# Patient Record
Sex: Female | Born: 1935 | Race: White | Hispanic: No | Smoking: Never smoker
Health system: Southern US, Community
[De-identification: ages and names within clinical notes are randomized; demographics above are authoritative.]

## PROBLEM LIST (undated history)

## (undated) DIAGNOSIS — K8020 Calculus of gallbladder without cholecystitis without obstruction: Secondary | ICD-10-CM

## (undated) DIAGNOSIS — I639 Cerebral infarction, unspecified: Secondary | ICD-10-CM

## (undated) DIAGNOSIS — K219 Gastro-esophageal reflux disease without esophagitis: Secondary | ICD-10-CM

## (undated) DIAGNOSIS — I10 Essential (primary) hypertension: Secondary | ICD-10-CM

## (undated) DIAGNOSIS — F319 Bipolar disorder, unspecified: Secondary | ICD-10-CM

## (undated) DIAGNOSIS — M1990 Unspecified osteoarthritis, unspecified site: Secondary | ICD-10-CM

## (undated) DIAGNOSIS — G629 Polyneuropathy, unspecified: Secondary | ICD-10-CM

## (undated) DIAGNOSIS — C50919 Malignant neoplasm of unspecified site of unspecified female breast: Secondary | ICD-10-CM

## (undated) DIAGNOSIS — I700 Atherosclerosis of aorta: Secondary | ICD-10-CM

## (undated) DIAGNOSIS — I4891 Unspecified atrial fibrillation: Secondary | ICD-10-CM

## (undated) DIAGNOSIS — C801 Malignant (primary) neoplasm, unspecified: Secondary | ICD-10-CM

## (undated) DIAGNOSIS — D3500 Benign neoplasm of unspecified adrenal gland: Secondary | ICD-10-CM

## (undated) HISTORY — DX: Polyneuropathy, unspecified: G62.9

## (undated) HISTORY — PX: APPENDECTOMY: SHX54

## (undated) HISTORY — DX: Gastro-esophageal reflux disease without esophagitis: K21.9

## (undated) HISTORY — DX: Cerebral infarction, unspecified: I63.9

---
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
## 1994-06-29 HISTORY — PX: PARATHYROIDECTOMY: SHX19

## 2008-06-29 HISTORY — PX: HERNIA REPAIR: SHX51

## 2010-06-29 HISTORY — PX: BREAST LUMPECTOMY: SHX2

## 2011-06-30 DIAGNOSIS — Z853 Personal history of malignant neoplasm of breast: Secondary | ICD-10-CM

## 2011-06-30 HISTORY — DX: Personal history of malignant neoplasm of breast: Z85.3

## 2014-06-29 DIAGNOSIS — D3500 Benign neoplasm of unspecified adrenal gland: Secondary | ICD-10-CM

## 2014-06-29 DIAGNOSIS — I700 Atherosclerosis of aorta: Secondary | ICD-10-CM

## 2014-06-29 DIAGNOSIS — K8020 Calculus of gallbladder without cholecystitis without obstruction: Secondary | ICD-10-CM

## 2014-06-29 DIAGNOSIS — I480 Paroxysmal atrial fibrillation: Secondary | ICD-10-CM

## 2014-06-29 HISTORY — DX: Calculus of gallbladder without cholecystitis without obstruction: K80.20

## 2014-06-29 HISTORY — DX: Benign neoplasm of unspecified adrenal gland: D35.00

## 2014-06-29 HISTORY — DX: Paroxysmal atrial fibrillation: I48.0

## 2014-06-29 HISTORY — DX: Atherosclerosis of aorta: I70.0

## 2016-04-02 DIAGNOSIS — I10 Essential (primary) hypertension: Secondary | ICD-10-CM | POA: Insufficient documentation

## 2016-04-02 DIAGNOSIS — E039 Hypothyroidism, unspecified: Secondary | ICD-10-CM | POA: Insufficient documentation

## 2016-04-02 DIAGNOSIS — G609 Hereditary and idiopathic neuropathy, unspecified: Secondary | ICD-10-CM | POA: Insufficient documentation

## 2016-04-02 DIAGNOSIS — I700 Atherosclerosis of aorta: Secondary | ICD-10-CM | POA: Insufficient documentation

## 2016-04-02 DIAGNOSIS — I480 Paroxysmal atrial fibrillation: Secondary | ICD-10-CM | POA: Insufficient documentation

## 2016-04-02 DIAGNOSIS — R29898 Other symptoms and signs involving the musculoskeletal system: Secondary | ICD-10-CM | POA: Insufficient documentation

## 2016-04-02 DIAGNOSIS — D3500 Benign neoplasm of unspecified adrenal gland: Secondary | ICD-10-CM | POA: Insufficient documentation

## 2016-04-02 DIAGNOSIS — M159 Polyosteoarthritis, unspecified: Secondary | ICD-10-CM | POA: Insufficient documentation

## 2016-04-02 DIAGNOSIS — N3941 Urge incontinence: Secondary | ICD-10-CM | POA: Insufficient documentation

## 2016-06-29 DIAGNOSIS — K7460 Unspecified cirrhosis of liver: Secondary | ICD-10-CM

## 2016-06-29 DIAGNOSIS — T50905A Adverse effect of unspecified drugs, medicaments and biological substances, initial encounter: Secondary | ICD-10-CM

## 2016-06-29 HISTORY — DX: Unspecified cirrhosis of liver: K74.60

## 2016-06-29 HISTORY — DX: Unspecified cirrhosis of liver: T50.905A

## 2016-10-26 ENCOUNTER — Encounter: Payer: Self-pay | Admitting: Emergency Medicine

## 2016-10-26 ENCOUNTER — Emergency Department: Payer: Medicare Other

## 2016-10-26 ENCOUNTER — Inpatient Hospital Stay
Admission: EM | Admit: 2016-10-26 | Discharge: 2016-10-29 | DRG: 947 | Disposition: A | Discharge status: Home Health | Payer: Medicare Other | Attending: Internal Medicine | Admitting: Internal Medicine

## 2016-10-26 DIAGNOSIS — Z8659 Personal history of other mental and behavioral disorders: Secondary | ICD-10-CM

## 2016-10-26 DIAGNOSIS — K7460 Unspecified cirrhosis of liver: Secondary | ICD-10-CM | POA: Diagnosis present

## 2016-10-26 DIAGNOSIS — G9341 Metabolic encephalopathy: Secondary | ICD-10-CM | POA: Diagnosis present

## 2016-10-26 DIAGNOSIS — K8020 Calculus of gallbladder without cholecystitis without obstruction: Secondary | ICD-10-CM

## 2016-10-26 DIAGNOSIS — F319 Bipolar disorder, unspecified: Secondary | ICD-10-CM | POA: Diagnosis present

## 2016-10-26 DIAGNOSIS — B179 Acute viral hepatitis, unspecified: Secondary | ICD-10-CM

## 2016-10-26 DIAGNOSIS — R5381 Other malaise: Secondary | ICD-10-CM | POA: Diagnosis present

## 2016-10-26 DIAGNOSIS — E039 Hypothyroidism, unspecified: Secondary | ICD-10-CM | POA: Diagnosis present

## 2016-10-26 DIAGNOSIS — I10 Essential (primary) hypertension: Secondary | ICD-10-CM | POA: Diagnosis present

## 2016-10-26 DIAGNOSIS — T426X5A Adverse effect of other antiepileptic and sedative-hypnotic drugs, initial encounter: Secondary | ICD-10-CM | POA: Diagnosis present

## 2016-10-26 DIAGNOSIS — Z7982 Long term (current) use of aspirin: Secondary | ICD-10-CM | POA: Diagnosis not present

## 2016-10-26 DIAGNOSIS — R7989 Other specified abnormal findings of blood chemistry: Secondary | ICD-10-CM | POA: Diagnosis present

## 2016-10-26 DIAGNOSIS — R531 Weakness: Secondary | ICD-10-CM | POA: Diagnosis present

## 2016-10-26 DIAGNOSIS — Z853 Personal history of malignant neoplasm of breast: Secondary | ICD-10-CM

## 2016-10-26 DIAGNOSIS — R945 Abnormal results of liver function studies: Secondary | ICD-10-CM

## 2016-10-26 DIAGNOSIS — Z79899 Other long term (current) drug therapy: Secondary | ICD-10-CM | POA: Diagnosis not present

## 2016-10-26 DIAGNOSIS — G4700 Insomnia, unspecified: Secondary | ICD-10-CM

## 2016-10-26 HISTORY — DX: Malignant (primary) neoplasm, unspecified: C80.1

## 2016-10-26 HISTORY — DX: Bipolar disorder, unspecified: F31.9

## 2016-10-26 HISTORY — DX: Calculus of gallbladder without cholecystitis without obstruction: K80.20

## 2016-10-26 HISTORY — DX: Benign neoplasm of unspecified adrenal gland: D35.00

## 2016-10-26 HISTORY — DX: Unspecified atrial fibrillation: I48.91

## 2016-10-26 HISTORY — DX: Atherosclerosis of aorta: I70.0

## 2016-10-26 HISTORY — DX: Unspecified osteoarthritis, unspecified site: M19.90

## 2016-10-26 HISTORY — DX: Essential (primary) hypertension: I10

## 2016-10-26 LAB — COMPREHENSIVE METABOLIC PANEL
ALK PHOS: 127 U/L — AB (ref 38–126)
ALT: 752 U/L — AB (ref 14–54)
ANION GAP: 9 (ref 5–15)
AST: 1129 U/L — ABNORMAL HIGH (ref 15–41)
Albumin: 3.6 g/dL (ref 3.5–5.0)
BUN: 22 mg/dL — ABNORMAL HIGH (ref 6–20)
CALCIUM: 9.5 mg/dL (ref 8.9–10.3)
CHLORIDE: 109 mmol/L (ref 101–111)
CO2: 23 mmol/L (ref 22–32)
Creatinine, Ser: 0.91 mg/dL (ref 0.44–1.00)
GFR, EST NON AFRICAN AMERICAN: 58 mL/min — AB (ref >60)
Glucose, Bld: 178 mg/dL — ABNORMAL HIGH (ref 65–99)
Potassium: 3.5 mmol/L (ref 3.5–5.1)
Sodium: 141 mmol/L (ref 135–145)
Total Bilirubin: 3.5 mg/dL — ABNORMAL HIGH (ref 0.3–1.2)
Total Protein: 6.9 g/dL (ref 6.5–8.1)

## 2016-10-26 LAB — ACETAMINOPHEN LEVEL: Acetaminophen (Tylenol), Serum: <10 ug/mL — ABNORMAL LOW (ref 10–30)

## 2016-10-26 LAB — BILIRUBIN, FRACTIONATED(TOT/DIR/INDIR)
BILIRUBIN INDIRECT: 1.5 mg/dL — AB (ref 0.3–0.9)
Bilirubin, Direct: 2.5 mg/dL — ABNORMAL HIGH (ref 0.1–0.5)
Total Bilirubin: 4 mg/dL — ABNORMAL HIGH (ref 0.3–1.2)

## 2016-10-26 LAB — TSH: TSH: 0.812 u[IU]/mL (ref 0.350–4.500)

## 2016-10-26 LAB — CBC
HCT: 43.7 % (ref 35.0–47.0)
Hemoglobin: 14.6 g/dL (ref 12.0–16.0)
MCH: 31.5 pg (ref 26.0–34.0)
MCHC: 33.4 g/dL (ref 32.0–36.0)
MCV: 94.3 fL (ref 80.0–100.0)
PLATELETS: 144 10*3/uL — AB (ref 150–440)
RBC: 4.63 MIL/uL (ref 3.80–5.20)
RDW: 14 % (ref 11.5–14.5)
WBC: 14.5 10*3/uL — ABNORMAL HIGH (ref 3.6–11.0)

## 2016-10-26 LAB — AMMONIA: Ammonia: 21 umol/L (ref 9–35)

## 2016-10-26 LAB — VALPROIC ACID LEVEL: Valproic Acid Lvl: 54 ug/mL (ref 50.0–100.0)

## 2016-10-26 LAB — PROTIME-INR
INR: 1.06
PROTHROMBIN TIME: 13.8 s (ref 11.4–15.2)

## 2016-10-26 LAB — CK: Total CK: 28 U/L — ABNORMAL LOW (ref 38–234)

## 2016-10-26 LAB — GAMMA GT: GGT: 387 U/L — AB (ref 7–50)

## 2016-10-26 LAB — TROPONIN I

## 2016-10-26 MED ORDER — ONDANSETRON HCL 4 MG/2ML IJ SOLN: 4.0000 mg | Freq: Four times a day (QID) | INTRAMUSCULAR | Status: DC | PRN

## 2016-10-26 MED ORDER — ONDANSETRON HCL 4 MG PO TABS: 4.0000 mg | ORAL_TABLET | Freq: Four times a day (QID) | ORAL | Status: DC | PRN

## 2016-10-26 MED ORDER — SODIUM CHLORIDE 0.9 % IV SOLN
INTRAVENOUS | Status: DC
Start: 2016-10-26 — End: 2016-10-28
  Administered 2016-10-26 – 2016-10-28 (×4): via INTRAVENOUS

## 2016-10-26 MED ORDER — SENNOSIDES-DOCUSATE SODIUM 8.6-50 MG PO TABS: 1.0000 | ORAL_TABLET | Freq: Every evening | ORAL | Status: DC | PRN

## 2016-10-26 MED ORDER — SODIUM CHLORIDE 0.9 % IV SOLN
1000.0000 mL | Freq: Once | INTRAVENOUS | Status: AC
  Administered 2016-10-26: 1000 mL via INTRAVENOUS

## 2016-10-26 MED ORDER — ENOXAPARIN SODIUM 40 MG/0.4ML ~~LOC~~ SOLN
40.0000 mg | SUBCUTANEOUS | Status: DC
Start: 2016-10-26 — End: 2016-10-29
  Administered 2016-10-26 – 2016-10-28 (×3): 40 mg via SUBCUTANEOUS
  Filled 2016-10-26 (×3): qty 0.4

## 2016-10-26 MED ORDER — LEVOTHYROXINE SODIUM 88 MCG PO TABS
88.0000 ug | ORAL_TABLET | Freq: Every day | ORAL | Status: DC
  Administered 2016-10-27 – 2016-10-29 (×3): 88 ug via ORAL
  Filled 2016-10-26 (×4): qty 1

## 2016-10-26 MED ORDER — ALBUTEROL SULFATE (2.5 MG/3ML) 0.083% IN NEBU: 2.5000 mg | INHALATION_SOLUTION | RESPIRATORY_TRACT | Status: DC | PRN

## 2016-10-26 MED ORDER — METOPROLOL TARTRATE 25 MG PO TABS
12.5000 mg | ORAL_TABLET | Freq: Two times a day (BID) | ORAL | Status: DC
  Administered 2016-10-26 – 2016-10-29 (×7): 12.5 mg via ORAL
  Filled 2016-10-26 (×7): qty 1

## 2016-10-26 MED ORDER — LOSARTAN POTASSIUM 50 MG PO TABS
25.0000 mg | ORAL_TABLET | Freq: Every day | ORAL | Status: DC
  Administered 2016-10-26 – 2016-10-29 (×4): 25 mg via ORAL
  Filled 2016-10-26 (×4): qty 1

## 2016-10-26 NOTE — H&P (Signed)
[facility] [place] NAME: Christy Mendoza    MR#:  [id number]  DATE OF BIRTH:  Jun 27, 1936  DATE OF ADMISSION:  10/26/2016  PRIMARY CARE PHYSICIAN: Tracie Harrier, MD   REQUESTING/REFERRING PHYSICIAN: Lavonia Drafts, MD  CHIEF COMPLAINT:   Chief Complaint  Patient presents with  . Weakness   Weakness and confusion today. HISTORY OF PRESENT ILLNESS:  Christy Mendoza  is a 81 y.o. female with a known history of bipolar disorder, breast cancer status post the surgery and hypertension.the patient was sent to ED due to falling on the table, unable to get up. She was found weak, onfused and drowsy.she denies any syncope, seizure, loss of consciousness or incontinence.she was found elevated AST 1129, ALT 752. DR. Vicente Males suggest that admitted patient for further workup. PAST MEDICAL HISTORY:   Past Medical History:  Diagnosis Date  . Bipolar 1 disorder ([place])   . Cancer ([place])    left breast  . Hypertension     PAST SURGICAL HISTORY:  History reviewed. No pertinent surgical history.  SOCIAL HISTORY:   Social History  Substance Use Topics  . Smoking status: Never Smoker  . Smokeless tobacco: Never Used  . Alcohol use No    FAMILY HISTORY:  History reviewed. No pertinent family history.  DRUG ALLERGIES:   Allergies  Allergen Reactions  . Hydrochlorothiazide Shortness Of Breath  . Lisinopril Shortness Of Breath  . Cephalexin Other (See Comments)    REVIEW OF SYSTEMS:   Review of Systems  Constitutional: Positive for malaise/fatigue. Negative for chills and fever.  HENT: Negative for sore throat.   Eyes: Negative for blurred vision and double vision.  Respiratory: Negative for cough, shortness of breath, wheezing and stridor.   Cardiovascular: Negative for chest pain, palpitations and leg swelling.  Gastrointestinal: Positive for nausea. Negative for abdominal pain, blood in stool, constipation, diarrhea, melena and vomiting.    Genitourinary: Negative for dysuria and hematuria.  Musculoskeletal: Negative for back pain.  Skin: Negative for itching and rash.  Neurological: Positive for dizziness and weakness. Negative for focal weakness, loss of consciousness and headaches.    MEDICATIONS AT HOME:   Prior to Admission medications   Medication Sig Start Date End Date Taking? Authorizing Provider  aspirin 81 MG chewable tablet Chew 81 mg by mouth daily.   Yes Historical Provider, MD  folic acid (FOLVITE) 1 MG tablet Take 1 mg by mouth 2 (two) times daily.   Yes Historical Provider, MD  gabapentin (NEURONTIN) 600 MG tablet Take 1,200 mg by mouth at bedtime. 10/11/16  Yes Historical Provider, MD  levothyroxine (SYNTHROID, LEVOTHROID) 88 MCG tablet Take 88 mcg by mouth daily. 09/30/16  Yes Historical Provider, MD  losartan (COZAAR) 25 MG tablet Take 25 mg by mouth daily. 10/11/16  Yes Historical Provider, MD  metoprolol tartrate (LOPRESSOR) 25 MG tablet Take 12.5 mg by mouth 2 (two) times daily. 10/12/16  Yes Historical Provider, MD  valproic acid (DEPAKENE) 250 MG capsule Take 500 mg by mouth 2 (two) times daily. 10/14/16  Yes Historical Provider, MD  vitamin B-12 (CYANOCOBALAMIN) 500 MCG tablet Take 500 mcg by mouth 2 (two) times daily.   Yes Historical Provider, MD      VITAL SIGNS:  Blood pressure (!) 156/76, pulse 85, temperature 98.6 F (37 C), temperature source Oral, resp. rate 18, height 5\' 5"  (1.651 m), weight 189 lb 2 oz (85.8 kg), SpO2 95 %.  PHYSICAL EXAMINATION:  Physical Exam  GENERAL:  81 y.o.-year-old patient lying in the bed with no acute distress.  EYES: Pupils equal, round, reactive to light and accommodation. No scleral icterus. Extraocular muscles intact.  HEENT: Head atraumatic, normocephalic. Oropharynx and nasopharynx clear.  NECK:  Supple, no jugular venous distention. No thyroid enlargement, no tenderness.  LUNGS: Normal breath sounds bilaterally, no wheezing, rales,rhonchi or crepitation. No  use of accessory muscles of respiration.  CARDIOVASCULAR: S1, S2 normal. No murmurs, rubs, or gallops.  ABDOMEN: Soft, nontender, nondistended. Bowel sounds present. No organomegaly or mass.  EXTREMITIES: No pedal edema, cyanosis, or clubbing.  NEUROLOGIC: Cranial nerves II through XII are intact. Muscle strength 5/5 in all extremities. Sensation intact. Gait not checked.  PSYCHIATRIC: The patient is alert and oriented x 3.  SKIN: No obvious rash, lesion, or ulcer.   LABORATORY PANEL:   CBC  Recent Labs Lab 10/26/16 0817  WBC 14.5*  HGB 14.6  HCT 43.7  PLT 144*   ------------------------------------------------------------------------------------------------------------------  Chemistries   Recent Labs Lab 10/26/16 0817 10/26/16 1225  NA 141  --   K 3.5  --   CL 109  --   CO2 23  --   GLUCOSE 178*  --   BUN 22*  --   CREATININE 0.91  --   CALCIUM 9.5  --   AST 1,129*  --   ALT 752*  --   ALKPHOS 127*  --   BILITOT 3.5* 4.0*   ------------------------------------------------------------------------------------------------------------------  Cardiac Enzymes  Recent Labs Lab 10/26/16 0817  TROPONINI <0.03   ------------------------------------------------------------------------------------------------------------------  RADIOLOGY:  Dg Chest 1 View  Result Date: 10/26/2016 CLINICAL DATA:  Syncope.  Patient status post fall.  Weakness. EXAM: CHEST 1 VIEW COMPARISON:  None. FINDINGS: Monitoring leads overlie the patient. Normal cardiac and mediastinal contours. Low lung volumes. No consolidative pulmonary opacities. No pleural effusion or pneumothorax. Surgical clips left chest wall. IMPRESSION: No acute cardiopulmonary process. Electronically Signed   By: [name] M.D.   On: 10/26/2016 08:01   Ct Head Wo Contrast  Result Date: 10/26/2016 CLINICAL DATA:  Fall, weakness, nausea/ vomiting EXAM: CT HEAD WITHOUT CONTRAST TECHNIQUE: Contiguous axial images were  obtained from the base of the skull through the vertex without intravenous contrast. COMPARISON:  None. FINDINGS: Brain: No evidence of acute infarction, hemorrhage, hydrocephalus, extra-axial collection or mass lesion/mass effect. Subcortical white matter and periventricular small vessel ischemic changes. 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 Global cortical atrophy. Vascular: Intracranial atherosclerosis. Skull: Normal. Negative for fracture or focal lesion. Sinuses/Orbits: The visualized paranasal sinuses are essentially clear. The mastoid air cells are unopacified. Other: None. IMPRESSION: No evidence of acute intracranial abnormality. Atrophy with small vessel ischemic changes. 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 Electronically Signed   By: [name] M.D.   On: 10/26/2016 08:05   US Abdomen Limited Ruq  Result Date: 10/26/2016 CLINICAL DATA:  Elevated liver function studies EXAM: US ABDOMEN LIMITED - RIGHT UPPER QUADRANT COMPARISON:  None in PACs FINDINGS: Gallbladder: The gallbladder is adequately distended. There are echogenic mobile partially shadowing structures within the gallbladder compatible with stones with the largest measuring 1.3 cm. There is mild gallbladder wall thickening to have 3.8 mm. There is no positive sonographic Murphy's sign nor is there pericholecystic fluid. Common bile duct: Diameter: 6 mm.  No intraluminal stones are observed. Liver: The hepatic echotexture is mildly increased. It was difficult to penetrate the organ with the ultrasound beam. No discrete mass or ductal dilation is observed. The limited  visualization of the right kidney reveals mildly increased cortical echotexture. IMPRESSION: Gallstones with mild gallbladder wall thickening but no positive sonographic Murphy's sign to suggest acute cholecystitis. Normal appearing common bile duct. Increased hepatic echotexture consistent with known cirrhosis. Mildly increased  cortical echotexture of the partially imaged right kidney may reflect medical renal disease. Electronically Signed   By: [name] M.D.   On: 10/26/2016 10:11      IMPRESSION AND PLAN:   Abnormal liver function tests., Possible due to Depakote The patient will be admitted to medical floor. Hold Depakote, follow-up valproic acid level and ammonia level. Nothing by mouth except medication, IV fluid support. Check hepatitis panel. Follow-up liver function test and a GI consult.  Altered mental status, acute Encephalopathy. Possible due to medication Depakote. Follow-up ammonia level.  Liver cirrhosis. Follow-up GI consult.  All the records are reviewed and case discussed with ED provider. Management plans discussed with the patient, her daughterand they are in agreement.  CODE STATUS: Full code  TOTAL TIME TAKING CARE OF THIS PATIENT: 60 minutes.    Demetrios Loll M.D on 10/26/2016 at 2:18 PM  Between 7am to 6pm - Pager - [phone]  After 6pm go to [email or web address] - Proofreader  [facility]  Office  [phone]  CC: Primary care physician; Tracie Harrier, MD   Note: This dictation was prepared with Dragon dictation along with smaller phrase technology. Any transcriptional errors that result from this process are unintentional.

## 2016-10-26 NOTE — Consult Note (Signed)
Jonathon Bellows MD  [address]. [place], [place] [address] Phone: [phone] Fax : [phone]  Consultation  Referring Provider:    Dr Corky Downs  Primary Care Physician:  Tracie Harrier, MD Primary Gastroenterologist:  None          Reason for Consultation:     Abnormal LFT  Date of Admission:  10/26/2016 Date of Consultation:  10/26/2016         HPI:   Christy Mendoza is a 81 y.o. female presented to the ER a few hours back after a fall , no injury , she had vomited earlier in the day which was non bloody. I was consulted as it was incidentally noted that she had elevated transaminases.   She denies any recent diziness, did not loose consciousness during the fall, does not feel dizzy when she stands up , no recent excess tylenol use, no consumption of mushrooms or over the counter herbal supplements. No tatoos, military service, blood transfusions or  Alcohol consumption./ She is not aware of having cirrhosis of the liver.      Past Medical History:  Diagnosis Date  . Bipolar 1 disorder ([place])   . Cancer [facility])    left breast  . Hypertension     History reviewed. No pertinent surgical history.  Prior to Admission medications   Medication Sig Start Date End Date Taking? Authorizing Provider  gabapentin (NEURONTIN) 600 MG tablet Take 1,200 mg by mouth at bedtime. 10/11/16  Yes Historical Provider, MD  levothyroxine (SYNTHROID, LEVOTHROID) 88 MCG tablet Take 88 mcg by mouth daily. 09/30/16  Yes Historical Provider, MD  losartan (COZAAR) 25 MG tablet Take 25 mg by mouth daily. 10/11/16  Yes Historical Provider, MD  metoprolol tartrate (LOPRESSOR) 25 MG tablet Take 12.5 mg by mouth 2 (two) times daily. 10/12/16  Yes Historical Provider, MD  valproic acid (DEPAKENE) 250 MG capsule Take 500 mg by mouth 2 (two) times daily. 10/14/16  Yes Historical Provider, MD    History reviewed. No pertinent family history.   Social History  Substance Use Topics  . Smoking status: Never Smoker  .  Smokeless tobacco: Never Used  . Alcohol use No    Allergies as of 10/26/2016  . (No Known Allergies)    Review of Systems:    All systems reviewed and negative except where noted in HPI.   Physical Exam:  Vital signs in last 24 hours: Temp:  [98.6 F (37 C)] 98.6 F (37 C) (04/30 0736) Pulse Rate:  [80-89] 85 (04/30 1017) Resp:  [15-19] 19 (04/30 1017) BP: (126-140)/(71-89) 136/71 (04/30 1017) SpO2:  [93 %-96 %] 93 % (04/30 1017) Weight:  [200 lb (90.7 kg)] 200 lb (90.7 kg) (04/30 0738)   General:   Pleasant, cooperative in NAD Head:  Normocephalic and atraumatic. Eyes:   No icterus.   Conjunctiva pink. PERRLA. Ears:  Normal auditory acuity. Neck:  Supple; no masses or thyroidomegaly Lungs: Respirations even and unlabored. Lungs clear to auscultation bilaterally.   No wheezes, crackles, or rhonchi.  Heart:  Regular rate and rhythm;  Without murmur, clicks, rubs or gallops Abdomen:  Soft, nondistended, nontender. Normal bowel sounds. No appreciable masses or hepatomegaly.  No rebound or guarding.  Rectal:  Not performed. Msk:  Symmetrical without gross deformities.   Extremities:  Without edema, cyanosis or clubbing. Neurologic:  Alert and oriented x3;  grossly normal neurologically. Psych:  Alert and cooperative. Normal affect.  LAB RESULTS:  Recent Labs  10/26/16 0817  WBC 14.5*  HGB  14.6  HCT 43.7  PLT 144*   BMET  Recent Labs  10/26/16 0817  NA 141  K 3.5  CL 109  CO2 23  GLUCOSE 178*  BUN 22*  CREATININE 0.91  CALCIUM 9.5   LFT  Recent Labs  10/26/16 0817  PROT 6.9  ALBUMIN 3.6  AST 1,129*  ALT 752*  ALKPHOS 127*  BILITOT 3.5*   PT/INR No results for input(s): LABPROT, INR in the last 72 hours.  STUDIES: Dg Chest 1 View  Result Date: 10/26/2016 CLINICAL DATA:  Syncope.  Patient status post fall.  Weakness. EXAM: CHEST 1 VIEW COMPARISON:  None. FINDINGS: Monitoring leads overlie the patient. Normal cardiac and mediastinal contours. Low  lung volumes. No consolidative pulmonary opacities. No pleural effusion or pneumothorax. Surgical clips left chest wall. IMPRESSION: No acute cardiopulmonary process. Electronically Signed   By: [name] M.D.   On: 10/26/2016 08:01   Ct Head Wo Contrast  Result Date: 10/26/2016 CLINICAL DATA:  Fall, weakness, nausea/ vomiting EXAM: CT HEAD WITHOUT CONTRAST TECHNIQUE: Contiguous axial images were obtained from the base of the skull through the vertex without intravenous contrast. COMPARISON:  None. FINDINGS: Brain: No evidence of acute infarction, hemorrhage, hydrocephalus, extra-axial collection or mass lesion/mass effect. Subcortical white matter and periventricular small vessel ischemic changes. 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 Global cortical atrophy. Vascular: Intracranial atherosclerosis. Skull: Normal. Negative for fracture or focal lesion. Sinuses/Orbits: The visualized paranasal sinuses are essentially clear. The mastoid air cells are unopacified. Other: None. IMPRESSION: No evidence of acute intracranial abnormality. Atrophy with small vessel ischemic changes. 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 Electronically Signed   By: [name] M.D.   On: 10/26/2016 08:05   US Abdomen Limited Ruq  Result Date: 10/26/2016 CLINICAL DATA:  Elevated liver function studies EXAM: US ABDOMEN LIMITED - RIGHT UPPER QUADRANT COMPARISON:  None in PACs FINDINGS: Gallbladder: The gallbladder is adequately distended. There are echogenic mobile partially shadowing structures within the gallbladder compatible with stones with the largest measuring 1.3 cm. There is mild gallbladder wall thickening to have 3.8 mm. There is no positive sonographic Murphy's sign nor is there pericholecystic fluid. Common bile duct: Diameter: 6 mm.  No intraluminal stones are observed. Liver: The hepatic echotexture is mildly increased. It was difficult to penetrate the organ with the  ultrasound beam. No discrete mass or ductal dilation is observed. The limited visualization of the right kidney reveals mildly increased cortical echotexture. IMPRESSION: Gallstones with mild gallbladder wall thickening but no positive sonographic Murphy's sign to suggest acute cholecystitis. Normal appearing common bile duct. Increased hepatic echotexture consistent with known cirrhosis. Mildly increased cortical echotexture of the partially imaged right kidney may reflect medical renal disease. Electronically Signed   By: [name] M.D.   On: 10/26/2016 10:11      Impression / Plan:   Christy Mendoza is a 81 y.o. y/o female presented to the ER after a fall and was noted to have elevated transaminases. The pattern of elevattion can be seen in muscle injury from a fall or from hepatitis from ischemia, viral or toxins.   It is extremely rare for Gabapentin to cause this degree of hepatitis but Valproic acid can cause the same. She says she takes Valproate for mild bipolar disese  Plan 1. IV hydration  2. Monitor LFT's,INR daily, watch for encephalopathy clinically 3. Check acute hepatitis panel, viral serologies, acetaminophen levels, CK, ceruloplasmin,HIV tests.  4. USG liver suggests  cirrhosis and she will need further outpatient evaluation and follow up for the same.,  5. Doppler USG of the liver 6. Consider D/c Valproic acid and use alternative Rx for bipolar.   Thank you for involving me in the care of this patient.      LOS: 0 days   Jonathon Bellows, MD  10/26/2016, 11:02 AM

## 2016-10-26 NOTE — ED Triage Notes (Signed)
Ems from home status post fall. Pt arrived with large amount vomit on clothes. Pt states that she is weak. Hx htn, c-diff treatment, bipolar disorder.

## 2016-10-26 NOTE — ED Provider Notes (Signed)
[facility] Emergency Department Provider Note   ____________________________________________    I have reviewed the triage vital signs and the nursing notes.   HISTORY  Chief Complaint Weakness     HPI Christy Mendoza is a 81 y.o. female who presents after a fall which occurred approximately one hour ago. Patient lives at home. Patient reports she lost her balance and fell forward onto a coffee table. She denies injury. She denies neurological deficits or weakness in her extremities. No headache. No neck pain. No chest pain or palpitations. Patient reports she had vomited earlier in the day, nonbilious, nonbloody.   Past Medical History:  Diagnosis Date  . Bipolar 1 disorder ([place])   . Cancer [facility])    left breast  . Hypertension     Patient Active Problem List   Diagnosis Date Noted  . Abnormal LFTs 10/26/2016    History reviewed. No pertinent surgical history.  Prior to Admission medications   Medication Sig Start Date End Date Taking? Authorizing Provider  gabapentin (NEURONTIN) 600 MG tablet Take 1,200 mg by mouth at bedtime. 10/11/16  Yes Historical Provider, MD  levothyroxine (SYNTHROID, LEVOTHROID) 88 MCG tablet Take 88 mcg by mouth daily. 09/30/16  Yes Historical Provider, MD  losartan (COZAAR) 25 MG tablet Take 25 mg by mouth daily. 10/11/16  Yes Historical Provider, MD  metoprolol tartrate (LOPRESSOR) 25 MG tablet Take 12.5 mg by mouth 2 (two) times daily. 10/12/16  Yes Historical Provider, MD  valproic acid (DEPAKENE) 250 MG capsule Take 500 mg by mouth 2 (two) times daily. 10/14/16  Yes Historical Provider, MD     Allergies Patient has no known allergies.  History reviewed. No pertinent family history.  Social History Social History  Substance Use Topics  . Smoking status: Never Smoker  . Smokeless tobacco: Never Used  . Alcohol use No    Review of Systems  Constitutional: No fever/chills Eyes: No visual changes.  ENT: No  Neck pain Cardiovascular: Denies chest pain. No palpitations Respiratory: Denies shortness of breath. Gastrointestinal: No current abdominal pain Genitourinary: Denies urinary frequency Musculoskeletal: Chronic back pain, unchanged Skin: Negative for abrasion or laceration Neurological: Negative for headaches or weakness   ____________________________________________   PHYSICAL EXAM:  VITAL SIGNS: ED Triage Vitals  Enc Vitals Group     BP 10/26/16 0736 140/89     Pulse Rate 10/26/16 0736 89     Resp --      Temp 10/26/16 0736 98.6 F (37 C)     Temp Source 10/26/16 0736 Oral     SpO2 10/26/16 0736 96 %     Weight 10/26/16 0738 200 lb (90.7 kg)     Height 10/26/16 0738 5\' 5"  (1.651 m)     Head Circumference --      Peak Flow --      Pain Score --      Pain Loc --      Pain Edu? --      Excl. in [place]? --     Constitutional: Alert and oriented. No acute distress. Pleasant and interactive Eyes: Conjunctivae are normal.  Head: Atraumatic. Nose: No congestion/rhinnorhea. Mouth/Throat: Mucous membranes are moist.   Neck:  Painless ROM, No vertebral tenderness to palpation Cardiovascular: Normal rate, regular rhythm. Grossly normal heart sounds.  Good peripheral circulation. Respiratory: Normal respiratory effort.  No retractions. Lungs CTAB. Gastrointestinal: Soft , mild tenderness diffusely. No distention.   Genitourinary: deferred Musculoskeletal:   Warm and well perfused Neurologic:  Normal speech and language. No gross focal neurologic deficits are appreciated.  Skin:  Skin is warm, dry and intact. No rash noted. Psychiatric: Mood and affect are normal. Speech and behavior are normal.  ____________________________________________   LABS (all labs ordered are listed, but only abnormal results are displayed)  Labs Reviewed  CBC - Abnormal; Notable for the following:       Result Value   WBC 14.5 (*)    Platelets 144 (*)    All other components within normal limits   COMPREHENSIVE METABOLIC PANEL - Abnormal; Notable for the following:    Glucose, Bld 178 (*)    BUN 22 (*)    AST 1,129 (*)    ALT 752 (*)    Alkaline Phosphatase 127 (*)    Total Bilirubin 3.5 (*)    GFR calc non Af Amer 58 (*)    All other components within normal limits  TROPONIN I   ____________________________________________  EKG  None ____________________________________________  RADIOLOGY  Ultrasound mildly thickened gallbladder wall, no acute cholecystitis apparent Chest x-ray and CT head normal ____________________________________________   PROCEDURES  Procedure(s) performed: No    Critical Care performed: No ____________________________________________   INITIAL IMPRESSION / ASSESSMENT AND PLAN / ED COURSE  Pertinent labs & imaging results that were available during my care of the patient were reviewed by me and considered in my medical decision making (see chart for details).  Patient with significantly elevated LFTs and alkaline phosphatase on lab work. This is a new finding for her. Ultrasound demonstrates some mild gallbladder wall thickness but no pericholecystic fluid. Discussed with Dr. Vicente Males of gastroenterology who feels this is not related to gallbladder, recommends hydration and admission and he will see the patient.  Informed family of the plan and signed out to Dr. Bridgett Larsson    ____________________________________________   FINAL CLINICAL IMPRESSION(S) / ED DIAGNOSES  Final diagnoses:  Elevated LFTs  Acute hepatitis      NEW MEDICATIONS STARTED DURING THIS VISIT:  New Prescriptions   No medications on file     Note:  This document was prepared using Dragon voice recognition software and may include unintentional dictation errors.    Lavonia Drafts, MD 10/26/16 1058

## 2016-10-27 DIAGNOSIS — F319 Bipolar disorder, unspecified: Secondary | ICD-10-CM

## 2016-10-27 DIAGNOSIS — Z8659 Personal history of other mental and behavioral disorders: Secondary | ICD-10-CM

## 2016-10-27 DIAGNOSIS — G4700 Insomnia, unspecified: Secondary | ICD-10-CM

## 2016-10-27 HISTORY — DX: Bipolar disorder, unspecified: F31.9

## 2016-10-27 LAB — COMPREHENSIVE METABOLIC PANEL
ALBUMIN: 3.3 g/dL — AB (ref 3.5–5.0)
ALK PHOS: 109 U/L (ref 38–126)
ALT: 574 U/L — ABNORMAL HIGH (ref 14–54)
AST: 550 U/L — AB (ref 15–41)
Anion gap: 6 (ref 5–15)
BILIRUBIN TOTAL: 5.1 mg/dL — AB (ref 0.3–1.2)
BUN: 18 mg/dL (ref 6–20)
CALCIUM: 8.9 mg/dL (ref 8.9–10.3)
CO2: 22 mmol/L (ref 22–32)
CREATININE: 0.79 mg/dL (ref 0.44–1.00)
Chloride: 120 mmol/L — ABNORMAL HIGH (ref 101–111)
GFR calc Af Amer: >60 mL/min (ref >60)
GFR calc non Af Amer: >60 mL/min (ref >60)
GLUCOSE: 91 mg/dL (ref 65–99)
Potassium: 3.6 mmol/L (ref 3.5–5.1)
Sodium: 148 mmol/L — ABNORMAL HIGH (ref 135–145)
TOTAL PROTEIN: 6.2 g/dL — AB (ref 6.5–8.1)

## 2016-10-27 LAB — HEPATITIS B E ANTIGEN: HEP B E AG: NEGATIVE

## 2016-10-27 LAB — CBC
HEMATOCRIT: 39.8 % (ref 35.0–47.0)
HEMOGLOBIN: 13.3 g/dL (ref 12.0–16.0)
MCH: 31 pg (ref 26.0–34.0)
MCHC: 33.3 g/dL (ref 32.0–36.0)
MCV: 93.2 fL (ref 80.0–100.0)
Platelets: 140 10*3/uL — ABNORMAL LOW (ref 150–440)
RBC: 4.27 MIL/uL (ref 3.80–5.20)
RDW: 14.3 % (ref 11.5–14.5)
WBC: 11.7 10*3/uL — ABNORMAL HIGH (ref 3.6–11.0)

## 2016-10-27 LAB — EBV AB TO VIRAL CAPSID AG PNL, IGG+IGM

## 2016-10-27 LAB — HSV(HERPES SIMPLEX VRS) I + II AB-IGM: HSVI/II Comb IgM: <0.91 Ratio (ref 0.00–0.90)

## 2016-10-27 LAB — CERULOPLASMIN: CERULOPLASMIN: 25.2 mg/dL (ref 19.0–39.0)

## 2016-10-27 LAB — HEPATITIS B DNA, ULTRAQUANTITATIVE, PCR
HBV DNA SERPL PCR-ACNC: NOT DETECTED [IU]/mL
HBV DNA SERPL PCR-LOG IU: UNDETERMINED {Log_IU}/mL

## 2016-10-27 LAB — HEPATITIS C VRS RNA DETECT BY PCR-QUAL: Hepatitis C Vrs RNA by PCR-Qual: NEGATIVE

## 2016-10-27 LAB — HEPATITIS B CORE ANTIBODY, IGM: Hep B C IgM: NEGATIVE

## 2016-10-27 LAB — HEPATITIS C ANTIBODY: HCV Ab: <0.1 s/co ratio (ref 0.0–0.9)

## 2016-10-27 LAB — HIV ANTIBODY (ROUTINE TESTING W REFLEX): HIV Screen 4th Generation wRfx: NONREACTIVE

## 2016-10-27 LAB — HEPATITIS A ANTIBODY, IGM: Hep A IgM: NEGATIVE

## 2016-10-27 LAB — HEPATITIS B SURFACE ANTIGEN: HEP B S AG: NEGATIVE

## 2016-10-27 NOTE — Clinical Social Work Note (Addendum)
CSW received referral for SNF patient is refusing SNF.  Case discussed with case manager and plan is to discharge home with home health.  CSW to sign off please re-consult if social work needs arise.  Angelie Kram R. Vickey Ewbank, MSW, LCSWA [phone]  

## 2016-10-27 NOTE — Care Management Note (Signed)
Case Management Note  Patient Details  Name: Onesty Clair MRN: [id number] Date of Birth: 03-19-1936  Subjective/Objective:                  Lives with daughter Peter [place] from [place]. She typically uses a rollator.They do not have a front-wheeled walker. She declines needing one.  One-bedroom apartment connected to daughter's home. Meals on Wheels M-F. Walk in shower with monitoring by daughter. Power recliner however she has not needed it to stand. Toilet is handicap assessible. Bar placed beside bed to help get up. [employer]. Home health agency list provided.  Action/Plan: Referral to [facility] for bedside commode. RNCM to follow for home health needs.   Expected Discharge Date:                  Expected Discharge Plan:     In-House Referral:     Discharge planning Services  CM Consult  Post Acute Care Choice:  Home Health, Durable Medical Equipment Choice offered to:  Adult Children  DME Arranged:    DME Agency:     HH Arranged:  PT HH Agency:     Status of Service:  In process, will continue to follow  If discussed at Long Length of Stay Meetings, dates discussed:    Additional Comments:  Marshell Garfinkel, RN 10/27/2016, 3:06 PM

## 2016-10-27 NOTE — Progress Notes (Signed)
[facility] [place] NAME: Christy Mendoza    MR#:  [id number]  DATE OF BIRTH:  07/26/1935  SUBJECTIVE:  Patient here with weakness  Confusion improved a bit REVIEW OF SYSTEMS:    Review of Systems  Constitutional: Negative for fever, chills weight loss HENT: Negative for ear pain, nosebleeds, congestion, facial swelling, rhinorrhea, neck pain, neck stiffness and ear discharge.   Respiratory: Negative for cough, shortness of breath, wheezing  Cardiovascular: Negative for chest pain, palpitations and leg swelling.  Gastrointestinal: Negative for heartburn, abdominal pain, vomiting, diarrhea or consitpation Genitourinary: Negative for dysuria, urgency, frequency, hematuria Musculoskeletal: Negative for back pain or joint pain Neurological: Negative for dizziness, seizures, syncope, focal weakness,  numbness and headaches.  Hematological: Does not bruise/bleed easily.  Psychiatric/Behavioral: Negative for hallucinations, confusion, dysphoric mood    Tolerating Diet:yes      DRUG ALLERGIES:   Allergies  Allergen Reactions  . Hydrochlorothiazide Shortness Of Breath  . Lisinopril Shortness Of Breath  . Cephalexin Other (See Comments)    VITALS:  Blood pressure (!) 134/56, pulse 61, temperature 98.9 F (37.2 C), temperature source Oral, resp. rate 20, height 5\' 5"  (1.651 m), weight 85.8 kg (189 lb 2 oz), SpO2 95 %.  PHYSICAL EXAMINATION:  Constitutional: Appears well-developed and well-nourished. No distress. HENT: Normocephalic. [name] Oropharynx is clear and moist.  Eyes: Conjunctivae and EOM are normal. PERRLA, no scleral icterus.  Neck: Normal ROM. Neck supple. No JVD. No tracheal deviation. CVS: RRR, S1/S2 +, no murmurs, no gallops, no carotid bruit.  Pulmonary: Effort and breath sounds normal, no stridor, rhonchi, wheezes, rales.  Abdominal: Soft. BS +,  no distension, tenderness, rebound or guarding.  Musculoskeletal: Normal range  of motion. No edema and no tenderness.  Neuro: Alert. CN 2-12 grossly intact. No focal deficits. Oriented to name and place says 1916 Skin: Skin is warm and dry. No rash noted. Psychiatric: Normal mood and affect.      LABORATORY PANEL:   CBC  Recent Labs Lab 10/27/16 0525  WBC 11.7*  HGB 13.3  HCT 39.8  PLT 140*   ------------------------------------------------------------------------------------------------------------------  Chemistries   Recent Labs Lab 10/27/16 0525  NA 148*  K 3.6  CL 120*  CO2 22  GLUCOSE 91  BUN 18  CREATININE 0.79  CALCIUM 8.9  AST 550*  ALT 574*  ALKPHOS 109  BILITOT 5.1*   ------------------------------------------------------------------------------------------------------------------  Cardiac Enzymes  Recent Labs Lab 10/26/16 0817  TROPONINI <0.03   ------------------------------------------------------------------------------------------------------------------  RADIOLOGY:  Dg Chest 1 View  Result Date: 10/26/2016 CLINICAL DATA:  Syncope.  Patient status post fall.  Weakness. EXAM: CHEST 1 VIEW COMPARISON:  None. FINDINGS: Monitoring leads overlie the patient. Normal cardiac and mediastinal contours. Low lung volumes. No consolidative pulmonary opacities. No pleural effusion or pneumothorax. Surgical clips left chest wall. IMPRESSION: No acute cardiopulmonary process. Electronically Signed   By: [name] M.D.   On: 10/26/2016 08:01   Ct Head Wo Contrast  Result Date: 10/26/2016 CLINICAL DATA:  Fall, weakness, nausea/ vomiting EXAM: CT HEAD WITHOUT CONTRAST TECHNIQUE: Contiguous axial images were obtained from the base of the skull through the vertex without intravenous contrast. COMPARISON:  None. FINDINGS: Brain: No evidence of acute infarction, hemorrhage, hydrocephalus, extra-axial collection or mass lesion/mass effect. Subcortical white matter and periventricular small vessel ischemic changes. Old right basal ganglia and  left thalamic lacunar infarcts. Global cortical atrophy. Vascular: Intracranial atherosclerosis. Skull: Normal. Negative for fracture or focal lesion. Sinuses/Orbits: The visualized  paranasal sinuses are essentially clear. The mastoid air cells are unopacified. Other: None. IMPRESSION: No evidence of acute intracranial abnormality. Atrophy with small vessel ischemic changes. 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 Electronically Signed   By: [name] M.D.   On: 10/26/2016 08:05   US Abdomen Limited Ruq  Result Date: 10/26/2016 CLINICAL DATA:  Elevated liver function studies EXAM: US ABDOMEN LIMITED - RIGHT UPPER QUADRANT COMPARISON:  None in PACs FINDINGS: Gallbladder: The gallbladder is adequately distended. There are echogenic mobile partially shadowing structures within the gallbladder compatible with stones with the largest measuring 1.3 cm. There is mild gallbladder wall thickening to have 3.8 mm. There is no positive sonographic Murphy's sign nor is there pericholecystic fluid. Common bile duct: Diameter: 6 mm.  No intraluminal stones are observed. Liver: The hepatic echotexture is mildly increased. It was difficult to penetrate the organ with the ultrasound beam. No discrete mass or ductal dilation is observed. The limited visualization of the right kidney reveals mildly increased cortical echotexture. IMPRESSION: Gallstones with mild gallbladder wall thickening but no positive sonographic Murphy's sign to suggest acute cholecystitis. Normal appearing common bile duct. Increased hepatic echotexture consistent with known cirrhosis. Mildly increased cortical echotexture of the partially imaged right kidney may reflect medical renal disease. Electronically Signed   By: [name] M.D.   On: 10/26/2016 10:11     ASSESSMENT AND PLAN:   81 y/o Female with history of bipolar affect disorder, breast cancer and essential hypertension who presented with weakness and found to  have elevated LFTs.   1. Elevated liver function tests due to Depakote Depakote has been discontinued LFTs trending down Abdominal ultrasound shows gallstone but no evidence of acute cholecystitis or dilated bile ducts Appreciate GI consult  2. Acute metabolic encephalopathy; improving  3. Liver cirrhosis: Patient will need GI follow-up 4. Hypothyroid: Continue Synthroid  5. Essential hypertension: Continue Cozaar and metoprolol  6. Bipolar affect disorder: Psychiatry consult for other medications other than Depakote  Management plans discussed with the patient and daughter and she is in agreement.  CODE STATUS: FULL  TOTAL TIME TAKING CARE OF THIS PATIENT: 34 minutes.     POSSIBLE D/C 1-2 days, DEPENDING ON CLINICAL CONDITION.   Lennon Boutwell M.D on 10/27/2016 at 12:57 PM  Between 7am to 6pm - Pager - [phone] After 6pm go to [email or web address] - password [email or web address]  Office  [phone]  CC: Primary care physician; Tracie Harrier, MD  Note: This dictation was prepared with Dragon dictation along with smaller phrase technology. Any transcriptional errors that result from this process are unintentional.

## 2016-10-27 NOTE — Consult Note (Signed)
SURGICAL CONSULTATION NOTE (initial) - cpt: [id number]  HISTORY OF PRESENT ILLNESS (HPI):  81 y.o. very pleasant female presented to [facility] ED yesterday following a fall from standing onto a table attributed to generalized weakness. Workup was significant for elevated LFT's including bilirubin, AST, and ALT with normal alkaline phosphatase. Right upper quadrant ultrasound demonstrated gallstones with normal common bile duct diameter and no visualization of CBD gallstones. Patient denies any abdominal pain, post-prandial or otherwise, denies any prior abdominal surgeries, and denies any significant history of alcohol consumption, known viral hepatitis history, or excess Tylenol use. Patient otherwise denies N/V, fever/chills, CP, or SOB and reports feeling better since time of her presentation and hospital admission.  Surgery is consulted by medical physician Dr. Benjie Karvonen in this context for evaluation of cholelithiasis with hyperbilirubinemia and hypertransaminasemia.  PAST MEDICAL HISTORY (PMH):  Past Medical History:  Diagnosis Date  . Adrenal adenoma   . Atherosclerosis of aorta ([place])   . Atrial fibrillation ([place])   . Bipolar 1 disorder ([place])   . Cancer ([place])    left breast  . Cholelithiasis   . Hypertension   . Osteoarthritis      PAST SURGICAL HISTORY [facility]):  Past Surgical History:  Procedure Laterality Date  . BREAST LUMPECTOMY Left 2013  . HERNIA REPAIR  2010  . PARATHYROIDECTOMY  1996     MEDICATIONS:  Prior to Admission medications   Medication Sig Start Date End Date Taking? Authorizing Provider  aspirin 81 MG chewable tablet Chew 81 mg by mouth daily.   Yes Historical Provider, MD  folic acid (FOLVITE) 1 MG tablet Take 1 mg by mouth 2 (two) times daily.   Yes Historical Provider, MD  gabapentin (NEURONTIN) 600 MG tablet Take 1,200 mg by mouth at bedtime. 10/11/16  Yes Historical Provider, MD  levothyroxine (SYNTHROID, LEVOTHROID) 88 MCG tablet Take 88 mcg by mouth daily. 09/30/16   Yes Historical Provider, MD  losartan (COZAAR) 25 MG tablet Take 25 mg by mouth daily. 10/11/16  Yes Historical Provider, MD  metoprolol tartrate (LOPRESSOR) 25 MG tablet Take 12.5 mg by mouth 2 (two) times daily. 10/12/16  Yes Historical Provider, MD  valproic acid (DEPAKENE) 250 MG capsule Take 500 mg by mouth 2 (two) times daily. 10/14/16  Yes Historical Provider, MD  vitamin B-12 (CYANOCOBALAMIN) 500 MCG tablet Take 500 mcg by mouth 2 (two) times daily.   Yes Historical Provider, MD     ALLERGIES:  Allergies  Allergen Reactions  . Hydrochlorothiazide Shortness Of Breath  . Lisinopril Shortness Of Breath  . Cephalexin Other (See Comments)     SOCIAL HISTORY:  Social History   Social History  . Marital status: Divorced    Spouse name: N/A  . Number of children: N/A  . Years of education: N/A   Occupational History  . Not on file.   Social History Main Topics  . Smoking status: Never Smoker  . Smokeless tobacco: Never Used  . Alcohol use No  . Drug use: No  . Sexual activity: Not on file   Other Topics Concern  . Not on file   Social History Narrative  . No narrative on file    The patient currently resides (home / rehab facility / nursing home): Home with daughter  The patient normally is (ambulatory / bedbound): Ambulatory   FAMILY HISTORY:  History reviewed. No pertinent family history.   REVIEW OF SYSTEMS:  Constitutional: denies weight loss, fever, chills, or sweats  Eyes: denies any other vision changes,  history of eye injury  ENT: denies sore throat, hearing problems  Respiratory: denies shortness of breath, wheezing  Cardiovascular: denies chest pain, palpitations  Gastrointestinal: denies abdominal pain, N/V, or diarrhea Genitourinary: denies burning with urination or urinary frequency Musculoskeletal: denies any other joint pains or cramps  Skin: denies any other rashes or skin discolorations  Neurological: weakness as per HPI, denies any headache or  dizziness  Psychiatric: denies any other depression, anxiety   All other review of systems were negative   VITAL SIGNS:  Temp:  [98.5 F (36.9 C)-98.9 F (37.2 C)] 98.9 F (37.2 C) (05/01 0510) Pulse Rate:  [57-85] 61 (05/01 0510) Resp:  [15-20] 20 (05/01 0510) BP: (126-156)/(56-77) 134/56 (05/01 0510) SpO2:  [93 %-96 %] 95 % (05/01 0510) Weight:  [189 lb 2 oz (85.8 kg)] 189 lb 2 oz (85.8 kg) (04/30 1205)     Height: 5\' 5"  (165.1 cm) Weight: 189 lb 2 oz (85.8 kg) BMI (Calculated): 31.5   INTAKE/OUTPUT:  This shift: No intake/output data recorded.  Last 2 shifts: @IOLAST2SHIFTS @   PHYSICAL EXAM:  Constitutional:  -- +Jaundice -- Normal overweight body habitus  -- Awake, alert, and oriented x3  Eyes:  -- Pupils equally round and reactive to light  -- +Scleral icterus  Ear, nose, and throat:  -- No jugular venous distension  Pulmonary:  -- No crackles  -- Equal breath sounds bilaterally -- Breathing non-labored at rest Cardiovascular:  -- S1, S2 present  -- No pericardial rubs Gastrointestinal:  -- Abdomen soft, nontender, nondistended, no guarding/rebound  -- No abdominal masses appreciated, pulsatile or otherwise  Musculoskeletal and Integumentary:  -- Wounds or skin discoloration: None appreciated -- Extremities: B/L UE and LE FROM, hands and feet warm, no edema  Neurologic:  -- Motor function: intact and symmetric -- Sensation: intact and symmetric  Labs:  CBC Latest Ref Rng & Units 10/27/2016 10/26/2016  WBC 3.6 - 11.0 K/uL 11.7(H) 14.5(H)  Hemoglobin 12.0 - 16.0 g/dL 13.3 14.6  Hematocrit 35.0 - 47.0 % 39.8 43.7  Platelets 150 - 440 K/uL 140(L) 144(L)   CMP Latest Ref Rng & Units 10/27/2016 10/26/2016 10/26/2016  Glucose 65 - 99 mg/dL 91 - 178(H)  BUN 6 - 20 mg/dL 18 - 22(H)  Creatinine 0.44 - 1.00 mg/dL 0.79 - 0.91  Sodium 135 - 145 mmol/L 148(H) - 141  Potassium 3.5 - 5.1 mmol/L 3.6 - 3.5  Chloride 101 - 111 mmol/L 120(H) - 109  CO2 22 - 32 mmol/L 22 - 23   Calcium 8.9 - 10.3 mg/dL 8.9 - 9.5  Total Protein 6.5 - 8.1 g/dL 6.2(L) - 6.9  Total Bilirubin 0.3 - 1.2 mg/dL 5.1(H) 4.0(H) 3.5(H)  Alkaline Phos 38 - 126 U/L 109 - 127(H)  AST 15 - 41 U/L 550(H) - 1,129(H)  ALT 14 - 54 U/L 574(H) - 752(H)    Imaging studies:  Limited Right Upper Quadrant Abdominal Ultrasound (10/27/2016) Gallbladder: The gallbladder is adequately distended. There are echogenic mobile partially shadowing structures within the gallbladder compatible with stones with the largest measuring 1.3 cm. There is mild gallbladder wall thickening to have 3.8 mm. There is no positive sonographic Murphy's sign nor is there pericholecystic fluid.  Common bile duct: Diameter: 6 mm.  No intraluminal stones are observed.  Liver: The hepatic echotexture is mildly increased. It was difficult to penetrate the organ with the ultrasound beam. No discrete mass or ductal dilation is observed.  Assessment/Plan: (ICD-10's: K80.00) 81 y.o. female with jaundice secondary to  decompensated cirrhosis being attributed at least in part to chronic valproic acid for bipolar disorder with incidental finding of asymptomatic cholelithiasis, complicated by generalized deconditioning and weakness pertinent comorbidities including atrial fibrillation, HTN, bipolar disorder, osteoarthritis, and a history of breast cancer s/p resection.   - IVF hydration   - trend daily LFT's and INR   - no indication for surgical intervention at this time  - medical management of comorbidities as per medical team  - GI recommendations appreciated  - DVT prophylaxis  All of the above findings and recommendations were discussed with the patient and her daughter, and all of patient's and her family's questions were answered to their expressed satisfaction.  Thank you for the opportunity to participate in this patient's care.   -- Marilynne Drivers Rosana Hoes, MD, [place]: [facility] -  Partnering for exceptional care. Office: [phone]

## 2016-10-27 NOTE — Consult Note (Signed)
[facility] Consult   Reason for Consult:  Consult for 81 year old woman who came to the hospital after a fall and was found to have elevated liver enzymes. Question about psychiatric medication. Referring Physician:  Mody Patient Identification: Christy Mendoza MRN:  [id number] Principal Diagnosis: History of bipolar disorder Diagnosis:   Patient Active Problem List   Diagnosis Date Noted  . History of bipolar disorder [Z86.59] 10/27/2016  . Insomnia [G47.00] 10/27/2016  . Abnormal LFTs [R94.5] 10/26/2016    Total Time spent with patient: 1 hour  Subjective:   Christy Mendoza is a 81 y.o. female patient admitted with "I got dizzy".  HPI:  Patient interviewed. Chart reviewed. Patient's daughter was in the room and dominated the conversation giving the history although I was able to get some information directly from the patient. This is a 81 year old woman came to the hospital after a fall at home. She was found to have elevated liver enzymes and abnormal liver function tests which are being worked up. One likely etiology would be her chronic use of valproic acid. Patient currently reports that she is not back to feeling normal. She has a hard time articulating exactly how she is feeling. Does not report feeling depressed or having any significant emotional change. Daughter reports that the patient's current mental status is abnormal for her. At baseline the patient is reported to be mentally alert and sharp and engaged whereas this point she seems to be slowed down. There had been no report however of any depressive symptoms or psychiatric symptoms prior to hospitalization. Patient has had long-standing chronic complaints of insomnia which should been somewhat worse recently. No report of any psychotic symptoms.  Social history: Patient lives in an apartment connected to the home of her daughter and the daughter's family. All of them recently relocated from [place] to [place]  and have been here for about a year. Daughter is clearly very involved and concerned in her mother's care.  Medical history: Patient does have a history of some significant medical problems in the past including breast cancer adrenal insufficiency atherosclerosis atrial fibrillation. Has a primary care doctor in the area.  Substance abuse history: None reported  Past Psychiatric History: Patient reportedly had been diagnosed with bipolar disorder many years ago. All of her prior treatment occurred in [place]. The patient herself is not able to give much detail. Daughter reports that the patient did have psychiatric problems which mostly seemed to be depression in her younger years. She had hospitalization but this was also decades ago. The daughter is of the opinion that the diagnosis of bipolar disorder was largely based on problems with sleep. Daughter believes that the patient had never had a manic episode. Currently the patient's mental status is impaired to the point that it is difficult for her to give history. Also when I attempted to engage the patient in some conversation about her past psychiatric history the daughter interrupted and insisted that these kinds of questions were not appropriate.  Risk to Self: Is patient at risk for suicide?: No Risk to Others:   Prior Inpatient Therapy:   Prior Outpatient Therapy:    Past Medical History:  Past Medical History:  Diagnosis Date  . Adrenal adenoma   . Atherosclerosis of aorta ([place])   . Atrial fibrillation ([place])   . Bipolar 1 disorder ([place])   . Cancer ([place])    left breast  . Cholelithiasis   . Hypertension   . Osteoarthritis  Past Surgical History:  Procedure Laterality Date  . BREAST LUMPECTOMY Left 2013  . HERNIA REPAIR  2010  . PARATHYROIDECTOMY  1996   Family History: History reviewed. No pertinent family history. Family Psychiatric  History: Unknown Social History:  History  Alcohol Use No     History  Drug Use  No    Social History   Social History  . Marital status: Divorced    Spouse name: N/A  . Number of children: N/A  . Years of education: N/A   Social History Main Topics  . Smoking status: Never Smoker  . Smokeless tobacco: Never Used  . Alcohol use No  . Drug use: No  . Sexual activity: Not Asked   Other Topics Concern  . None   Social History Narrative  . None   Additional Social History:    Allergies:   Allergies  Allergen Reactions  . Hydrochlorothiazide Shortness Of Breath  . Lisinopril Shortness Of Breath  . Cephalexin Other (See Comments)    Labs:  Results for orders placed or performed during the hospital encounter of 10/26/16 (from the past 48 hour(s))  CBC     Status: Abnormal   Collection Time: 10/26/16  8:17 AM  Result Value Ref Range   WBC 14.5 (H) 3.6 - 11.0 K/uL   RBC 4.63 3.80 - 5.20 MIL/uL   Hemoglobin 14.6 12.0 - 16.0 g/dL   HCT 43.7 35.0 - 47.0 %   MCV 94.3 80.0 - 100.0 fL   MCH 31.5 26.0 - 34.0 pg   MCHC 33.4 32.0 - 36.0 g/dL   RDW 14.0 11.5 - 14.5 %   Platelets 144 (L) 150 - 440 K/uL  Comprehensive metabolic panel     Status: Abnormal   Collection Time: 10/26/16  8:17 AM  Result Value Ref Range   Sodium 141 135 - 145 mmol/L   Potassium 3.5 3.5 - 5.1 mmol/L   Chloride 109 101 - 111 mmol/L   CO2 23 22 - 32 mmol/L   Glucose, Bld 178 (H) 65 - 99 mg/dL   BUN 22 (H) 6 - 20 mg/dL   Creatinine, Ser 0.91 0.44 - 1.00 mg/dL   Calcium 9.5 8.9 - 10.3 mg/dL   Total Protein 6.9 6.5 - 8.1 g/dL   Albumin 3.6 3.5 - 5.0 g/dL   AST 1,129 (H) 15 - 41 U/L   ALT 752 (H) 14 - 54 U/L   Alkaline Phosphatase 127 (H) 38 - 126 U/L   Total Bilirubin 3.5 (H) 0.3 - 1.2 mg/dL   GFR calc non Af Amer 58 (L) >60 mL/min   GFR calc Af Amer >60 >60 mL/min    Comment: (NOTE) The eGFR has been calculated using the CKD EPI equation. This calculation has not been validated in all clinical situations. eGFR's persistently <60 mL/min signify possible Chronic  Kidney Disease.    Anion gap 9 5 - 15  Troponin I     Status: None   Collection Time: 10/26/16  8:17 AM  Result Value Ref Range   Troponin I <0.03 <0.03 ng/mL  Gamma GT     Status: Abnormal   Collection Time: 10/26/16  8:17 AM  Result Value Ref Range   GGT 387 (H) 7 - 50 U/L    Comment: Performed at [facility], [place] [address], [place], [place] [address]  Acetaminophen level     Status: Abnormal   Collection Time: 10/26/16 12:25 PM  Result Value Ref Range   Acetaminophen (  Tylenol), Serum <10 (L) 10 - 30 ug/mL    Comment:        THERAPEUTIC CONCENTRATIONS VARY SIGNIFICANTLY. A RANGE OF 10-30 ug/mL MAY BE AN EFFECTIVE CONCENTRATION FOR MANY PATIENTS. HOWEVER, SOME ARE BEST TREATED AT CONCENTRATIONS OUTSIDE THIS RANGE. ACETAMINOPHEN CONCENTRATIONS >150 ug/mL AT 4 HOURS AFTER INGESTION AND >50 ug/mL AT 12 HOURS AFTER INGESTION ARE OFTEN ASSOCIATED WITH TOXIC REACTIONS.   CK     Status: Abnormal   Collection Time: 10/26/16 12:25 PM  Result Value Ref Range   Total CK 28 (L) 38 - 234 U/L  Bilirubin, fractionated(tot/dir/indir)     Status: Abnormal   Collection Time: 10/26/16 12:25 PM  Result Value Ref Range   Total Bilirubin 4.0 (H) 0.3 - 1.2 mg/dL   Bilirubin, Direct 2.5 (H) 0.1 - 0.5 mg/dL   Indirect Bilirubin 1.5 (H) 0.3 - 0.9 mg/dL  Hepatitis A antibody, IgM     Status: None   Collection Time: 10/26/16 12:25 PM  Result Value Ref Range   Hep A IgM Negative Negative    Comment: (NOTE) Performed At: [facility] [address] [place], [place] [id number] Lindon Romp MD [id number]   Hepatitis B core antibody, IgM     Status: None   Collection Time: 10/26/16 12:25 PM  Result Value Ref Range   Hep B C IgM Negative Negative    Comment: (NOTE) Performed At: [facility] [place], [place] [id number] Lindon Romp MD [id number]   Hepatitis C antibody     Status: None   Collection Time: 10/26/16 12:25 PM  Result Value  Ref Range   HCV Ab <0.1 0.0 - 0.9 s/co ratio    Comment: (NOTE)                                  Negative:     < 0.8                             Indeterminate: 0.8 - 0.9                                  Positive:     > 0.9 The CDC recommends that a positive HCV antibody result be followed up with a HCV Nucleic Acid Amplification test (734287). Performed At: [facility] [place], [place] [id number] Lindon Romp MD [id number]   Hepatitis B surface antigen     Status: None   Collection Time: 10/26/16 12:25 PM  Result Value Ref Range   Hepatitis B Surface Ag Negative Negative    Comment: (NOTE) Performed At: [facility] [place], [place] [id number] Lindon Romp MD [id number]   Hepatitis B DNA, ultraquantitative, PCR     Status: None   Collection Time: 10/26/16 12:25 PM  Result Value Ref Range   HBV DNA SERPL PCR-ACNC HBV DNA not detected IU/mL   HBV DNA SERPL PCR-LOG IU UNABLE TO CALCULATE log10 IU/mL    Comment: (NOTE) Unable to calculate result since non-numeric result obtained for component test.    Test Info: Comment     Comment: (NOTE) The reportable range for this assay is 10 IU/mL to 1 billion IU/mL. Performed At: [facility] [place], [place] [id number] Lindon Romp MD  [id number]   Hepatitis B E antigen     Status: None   Collection Time: 10/26/16 12:25 PM  Result Value Ref Range   Hep B E Ag Negative Negative    Comment: (NOTE) Performed At: [facility] [address] [place], [place] [id number] Lindon Romp MD [id number]   EBV ab to viral capsid ag pnl, IgG+IgM     Status: Abnormal   Collection Time: 10/26/16 12:25 PM  Result Value Ref Range   EBV VCA IgG >600.0 (H) 0.0 - 17.9 U/mL    Comment: (NOTE)                                 Negative        <18.0                                 Equivocal 18.0 - 21.9                                 Positive         >21.9 Performed At: [facility] [place], [place] [id number] Lindon Romp MD [id number]    EBV VCA IgM <36.0 0.0 - 35.9 U/mL    Comment: (NOTE)                                 Negative        <36.0                                 Equivocal 36.0 - 43.9                                 Positive        >43.9   HIV antibody     Status: None   Collection Time: 10/26/16 12:25 PM  Result Value Ref Range   HIV Screen 4th Generation wRfx Non Reactive Non Reactive    Comment: (NOTE) Performed At: [facility] [place], [place] [id number] Lindon Romp MD [id number]   Ceruloplasmin     Status: None   Collection Time: 10/26/16 12:25 PM  Result Value Ref Range   Ceruloplasmin 25.2 19.0 - 39.0 mg/dL    Comment: (NOTE) Performed At: [facility] [place], [place] [id number] Lindon Romp MD [id number]   Protime-INR     Status: None   Collection Time: 10/26/16 12:25 PM  Result Value Ref Range   Prothrombin Time 13.8 11.4 - 15.2 seconds   INR 1.06   TSH     Status: None   Collection Time: 10/26/16 12:25 PM  Result Value Ref Range   TSH 0.812 0.350 - 4.500 uIU/mL    Comment: Performed by a 3rd Generation assay with a functional sensitivity of <=0.01 uIU/mL.  Valproic acid level     Status: None   Collection Time: 10/26/16 12:25 PM  Result Value Ref Range   Valproic Acid Lvl 54 50.0 - 100.0 ug/mL  Ammonia     Status: None   Collection Time: 10/26/16 12:25 PM  Result  Value Ref Range   Ammonia 21 9 - 35 umol/L  CBC     Status: Abnormal   Collection Time: 10/27/16  5:25 AM  Result Value Ref Range   WBC 11.7 (H) 3.6 - 11.0 K/uL   RBC 4.27 3.80 - 5.20 MIL/uL   Hemoglobin 13.3 12.0 - 16.0 g/dL   HCT 39.8 35.0 - 47.0 %   MCV 93.2 80.0 - 100.0 fL   MCH 31.0 26.0 - 34.0 pg   MCHC 33.3 32.0 - 36.0 g/dL   RDW 14.3 11.5 - 14.5 %   Platelets 140 (L) 150 - 440 K/uL  Comprehensive metabolic panel     Status:  Abnormal   Collection Time: 10/27/16  5:25 AM  Result Value Ref Range   Sodium 148 (H) 135 - 145 mmol/L   Potassium 3.6 3.5 - 5.1 mmol/L   Chloride 120 (H) 101 - 111 mmol/L   CO2 22 22 - 32 mmol/L   Glucose, Bld 91 65 - 99 mg/dL   BUN 18 6 - 20 mg/dL   Creatinine, Ser 0.79 0.44 - 1.00 mg/dL   Calcium 8.9 8.9 - 10.3 mg/dL   Total Protein 6.2 (L) 6.5 - 8.1 g/dL   Albumin 3.3 (L) 3.5 - 5.0 g/dL   AST 550 (H) 15 - 41 U/L   ALT 574 (H) 14 - 54 U/L   Alkaline Phosphatase 109 38 - 126 U/L   Total Bilirubin 5.1 (H) 0.3 - 1.2 mg/dL   GFR calc non Af Amer >60 >60 mL/min   GFR calc Af Amer >60 >60 mL/min    Comment: (NOTE) The eGFR has been calculated using the CKD EPI equation. This calculation has not been validated in all clinical situations. eGFR's persistently <60 mL/min signify possible Chronic Kidney Disease.    Anion gap 6 5 - 15    Current Facility-Administered Medications  Medication Dose Route Frequency Provider Last Rate Last Dose  . 0.9 %  sodium chloride infusion   Intravenous Continuous Demetrios Loll, MD 75 mL/hr at 10/27/16 1149    . albuterol (PROVENTIL) (2.5 MG/3ML) 0.083% nebulizer solution 2.5 mg  2.5 mg Nebulization Q2H PRN Demetrios Loll, MD      . enoxaparin (LOVENOX) injection 40 mg  40 mg Subcutaneous Q24H Demetrios Loll, MD   40 mg at 10/26/16 2114  . levothyroxine (SYNTHROID, LEVOTHROID) tablet 88 mcg  88 mcg Oral Daily Demetrios Loll, MD   88 mcg at 10/27/16 0535  . losartan (COZAAR) tablet 25 mg  25 mg Oral Daily Demetrios Loll, MD   25 mg at 10/27/16 0919  . metoprolol tartrate (LOPRESSOR) tablet 12.5 mg  12.5 mg Oral BID Demetrios Loll, MD   12.5 mg at 10/27/16 0919  . ondansetron (ZOFRAN) tablet 4 mg  4 mg Oral Q6H PRN Demetrios Loll, MD       Or  . ondansetron [facility]) injection 4 mg  4 mg Intravenous Q6H PRN Demetrios Loll, MD      . senna-docusate (Senokot-S) tablet 1 tablet  1 tablet Oral QHS PRN Demetrios Loll, MD        Musculoskeletal: Strength & Muscle Tone: decreased Gait & Station:  unable to stand Patient leans: N/A  Psychiatric Specialty Exam: Physical Exam  Nursing note and vitals reviewed. Constitutional: She appears well-developed and well-nourished.  HENT:  Head: Normocephalic and atraumatic.  Eyes: Conjunctivae are normal. Pupils are equal, round, and reactive to light.  Neck: Normal range of motion.  Cardiovascular: Regular rhythm and normal  heart sounds.   Respiratory: Effort normal.  GI: Soft.  Musculoskeletal: Normal range of motion.  Neurological: She is alert. She displays tremor.  Skin: Skin is warm and dry.  Psychiatric: Her affect is blunt. Her speech is delayed. She is slowed. Thought content is not paranoid. Cognition and memory are impaired. She expresses no homicidal and no suicidal ideation.    Review of Systems  Constitutional: Negative.   HENT: Negative.   Eyes: Negative.   Respiratory: Negative.   Cardiovascular: Negative.   Gastrointestinal: Negative.   Musculoskeletal: Negative.   Skin: Negative.   Neurological: Positive for dizziness and tremors.  Psychiatric/Behavioral: Negative for depression, hallucinations, memory loss, substance abuse and suicidal ideas. The patient has insomnia. The patient is not nervous/anxious.     Blood pressure (!) 148/59, pulse (!) 59, temperature 98.2 F (36.8 C), temperature source Oral, resp. rate 20, height '5\' 5"'$  (1.651 m), weight 85.8 kg (189 lb 2 oz), SpO2 95 %.Body mass index is 31.47 kg/m.  General Appearance: Casual  Eye Contact:  Minimal  Speech:  Slow  Volume:  Decreased  Mood:  Euthymic  Affect:  Constricted  Thought Process:  Coherent  Orientation:  Full (Time, Place, and Person)  Thought Content:  Logical  Suicidal Thoughts:  No  Homicidal Thoughts:  No  Memory:  Immediate;   Good Recent;   Fair Remote;   Good  Judgement:  Fair  Insight:  Fair  Psychomotor Activity:  Decreased  Concentration:  Concentration: Fair  Recall:  [employer] of Knowledge:  Fair  Language:  Fair   Akathisia:  No  Handed:  Right  AIMS (if indicated):     Assets:  Housing Social Support  ADL's:  Impaired  Cognition:  Impaired,  Mild  Sleep:        Treatment Plan Summary: Medication management and Plan 81 year old woman who presents with altered mental status, a fall and elevated liver function tests. Even after years of usage valproic acid does have a risk of liver toxicity. I agreed that it is very appropriate to discontinue the Depakote. I found the patient to be slow in her responses but coherent when she did speak. I was not able to get a very clear reliable past psychiatric history. I explained to the patient and the daughter that if the patient did have bipolar disorder, even if she had been symptom free for decades that there would be some potential increased risk for mood instability if all psychiatric medicines were discontinued. Daughter is STRONGLY in favor of the patient not being restarted on any psychiatric medication. The patient's chief concern appears to be some chronic insomnia problems. On the other hand it sounds like she's already had some difficulty with sleeping medicines in the past putting her at increased risk for falls. We discussed the increased risk of falls that can occur with virtually any kind of sleeping medicine. All in all the wisest thing for the time being seems to be to discontinue all psychiatric medicines and to monitor her mental state and improvement while in the hospital. This will probably need to be watched out for after she is discharged as well. No change to medication for now. I will follow in the hospital as needed.  Disposition: Patient does not meet criteria for psychiatric inpatient admission. Supportive therapy provided about ongoing stressors.  Alethia Berthold, MD 10/27/2016 5:34 PM

## 2016-10-27 NOTE — Evaluation (Signed)
Physical Therapy Evaluation Patient Details Name: Christy Mendoza MRN: [id number] DOB: 11-Nov-1935 Today's Date: 10/27/2016   History of Present Illness  81 y.o. female with a known history of bipolar disorder, breast cancer and hypertension. Patient was sent to ED due to fall at home, Unable to get up. She was found weak, confused and drowsy.  She denies any syncope, seizure, loss of consciousness or incontinence (pt was incontinent during PT exam).  Clinical Impression  Pt struggles with activities during PT exam.  When we went to get up pt was found to have had copious amounts of urine in her bed and diaper. She was able to take a few steps to get to the [facility] and urinate, but fatigued greatly with 1-2 minutes of standing to clean up and get new diaper on.  Pt was unable to get even close to putting on socks, could not roll over in bed or get to sitting and generally was much weaker than what she indicated was her baseline. Overall pt was very limited with what she could tolerate and generally does not appear that she would be safe to go home at this time and PT is suggesting bout at rehab. Pt indicating that she will be fine at home with assist from family.  If this is the course she ultimately decides on she will need HHPT.    Follow Up Recommendations SNF (Pt indicates that she does not want to go to rehab)    Equipment Recommendations       Recommendations for Other Services       Precautions / Restrictions Precautions Precautions: Fall Restrictions Weight Bearing Restrictions: No      Mobility  Bed Mobility Overal bed mobility: Needs Assistance Bed Mobility: Supine to Sit;Sit to Supine     Supine to sit: Max assist Sit to supine: Max assist   General bed mobility comments: Pt showed some effort, but was unable to initiate even minimal rolling/transfers  Transfers Overall transfer level: Needs assistance Equipment used: Rolling walker (2 wheeled) Transfers: Sit to/from  Stand Sit to Stand: Mod assist;Max assist         General transfer comment: Pt showed some effort in getting to standing but ultimately was very weak and limited.  Heavy cuing and encouragement to attain upright  Ambulation/Gait Ambulation/Gait assistance: Mod assist Ambulation Distance (Feet): 4 Feet Assistive device: Rolling walker (2 wheeled)       General Gait Details: Pt able to do some limited ambulation near EOB (getting to [facility]).  She fatigued very quickly with static standing (using walker) while cleaning up and PT assisting with putting on diaper.  Pt unable to tolerate more than very minimal walking this session.   Stairs            Wheelchair Mobility    Modified Rankin (Stroke Patients Only)       Balance                                             Pertinent Vitals/Pain Pain Assessment: No/denies pain    Home Living Family/patient expects to be discharged to:: Private residence (lives in attached suite) Living Arrangements: Children Available Help at Discharge: Family           Home Equipment: Gilford Rile - 2 wheels      Prior Function Level of Independence: Needs assistance  Comments: Pt indicates that she can do most of what she needed however it appears she is likely more limited than she lets on?     Hand Dominance        Extremity/Trunk Assessment   Upper Extremity Assessment Upper Extremity Assessment: Generalized weakness (Unable to lift shoulders over 90, generally weak)    Lower Extremity Assessment Lower Extremity Assessment: Generalized weakness (Pt with limited ROM and grossly only 3/5 strength)       Communication   Communication: No difficulties  Cognition Arousal/Alertness: Awake/alert Behavior During Therapy: Anxious;Restless Overall Cognitive Status: Difficult to assess                                 General Comments: Pt was able to answer some questions appropriately,  however she seemed confused at times and did not seem to be aware of her limitations      General Comments      Exercises     Assessment/Plan    PT Assessment Patient needs continued PT services  PT Problem List Decreased strength;Decreased range of motion;Decreased activity tolerance;Decreased balance;Decreased mobility;Decreased safety awareness;Decreased knowledge of use of DME;Pain;Decreased cognition;Decreased coordination       PT Treatment Interventions DME instruction;Gait training;Stair training;Functional mobility training;Therapeutic activities;Therapeutic exercise;Balance training;Patient/family education;Cognitive remediation;Neuromuscular re-education    PT Goals (Current goals can be found in the Care Plan section)  Acute Rehab PT Goals Patient Stated Goal: go home PT Goal Formulation: With patient Time For Goal Achievement: 11/10/16 Potential to Achieve Goals: Fair    Frequency Min 2X/week   Barriers to discharge        Co-evaluation               AM-PAC PT "6 Clicks" Daily Activity  Outcome Measure Difficulty turning over in bed (including adjusting bedclothes, sheets and blankets)?: Total Difficulty moving from lying on back to sitting on the side of the bed? : Total Difficulty sitting down on and standing up from a chair with arms (e.g., wheelchair, bedside commode, etc,.)?: Total Help needed moving to and from a bed to chair (including a wheelchair)?: A Lot Help needed walking in hospital room?: Total Help needed climbing 3-5 steps with a railing? : A Lot 6 Click Score: 8    End of Session Equipment Utilized During Treatment: Gait belt Activity Tolerance: Patient tolerated treatment well Patient left: with call bell/phone within reach;with bed alarm set Nurse Communication: Mobility status PT Visit Diagnosis: Muscle weakness (generalized) (M62.81);Difficulty in walking, not elsewhere classified (R26.2)    Time: 8242-3536 PT Time  Calculation (min) (ACUTE ONLY): 26 min   Charges:   PT Evaluation $PT Eval Low Complexity: 1 Procedure     PT G Codes:        Kreg Shropshire, DPT 10/27/2016, 11:47 AM

## 2016-10-27 NOTE — Progress Notes (Signed)
Jonathon Bellows MD [address], [place], [place] [address] Phone: [phone] Fax : [phone]  Christy Mendoza is being followed for abnormal LFT's  Day 2 of follow up   Subjective: No new concerns, complaints    Objective: Vital signs in last 24 hours: Vitals:   10/26/16 2007 10/27/16 0510 10/27/16 0510 10/27/16 1351  BP: (!) 156/77 (!) 134/56  (!) 148/59  Pulse: 74 (!) 57 61 (!) 59  Resp: '20 20  20  '$ Temp: 98.5 F (36.9 C) 98.9 F (37.2 C)  98.2 F (36.8 C)  TempSrc: Oral Oral  Oral  SpO2: 96% 96% 95% 95%  Weight:      Height:       Weight change:   Intake/Output Summary (Last 24 hours) at 10/27/16 1507 Last data filed at 10/27/16 1400  Gross per 24 hour  Intake             1360 ml  Output                0 ml  Net             1360 ml     Exam: Heart:: Regular rate and rhythm, S1S2 present or without murmur or extra heart sounds Lungs: normal, clear to auscultation and clear to auscultation and percussion Abdomen: soft, nontender, normal bowel sounds   Lab Results: Hepatic Function Latest Ref Rng & Units 10/27/2016 10/26/2016 10/26/2016  Total Protein 6.5 - 8.1 g/dL 6.2(L) - 6.9  Albumin 3.5 - 5.0 g/dL 3.3(L) - 3.6  AST 15 - 41 U/L 550(H) - 1,129(H)  ALT 14 - 54 U/L 574(H) - 752(H)  Alk Phosphatase 38 - 126 U/L 109 - 127(H)  Total Bilirubin 0.3 - 1.2 mg/dL 5.1(H) 4.0(H) 3.5(H)  Bilirubin, Direct 0.1 - 0.5 mg/dL - 2.5(H) -    Micro Results: No results found for this or any previous visit (from the past 240 hour(s)). Studies/Results: Dg Chest 1 View  Result Date: 10/26/2016 CLINICAL DATA:  Syncope.  Patient status post fall.  Weakness. EXAM: CHEST 1 VIEW COMPARISON:  None. FINDINGS: Monitoring leads overlie the patient. Normal cardiac and mediastinal contours. Low lung volumes. No consolidative pulmonary opacities. No pleural effusion or pneumothorax. Surgical clips left chest wall. IMPRESSION: No acute cardiopulmonary process. Electronically Signed   By:  [name] M.D.   On: 10/26/2016 08:01   Ct Head Wo Contrast  Result Date: 10/26/2016 CLINICAL DATA:  Fall, weakness, nausea/ vomiting EXAM: CT HEAD WITHOUT CONTRAST TECHNIQUE: Contiguous axial images were obtained from the base of the skull through the vertex without intravenous contrast. COMPARISON:  None. FINDINGS: Brain: No evidence of acute infarction, hemorrhage, hydrocephalus, extra-axial collection or mass lesion/mass effect. Subcortical white matter and periventricular small vessel ischemic changes. 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 Global cortical atrophy. Vascular: Intracranial atherosclerosis. Skull: Normal. Negative for fracture or focal lesion. Sinuses/Orbits: The visualized paranasal sinuses are essentially clear. The mastoid air cells are unopacified. Other: None. IMPRESSION: No evidence of acute intracranial abnormality. Atrophy with small vessel ischemic changes. 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 Electronically Signed   By: [name] M.D.   On: 10/26/2016 08:05   US Abdomen Limited Ruq  Result Date: 10/26/2016 CLINICAL DATA:  Elevated liver function studies EXAM: US ABDOMEN LIMITED - RIGHT UPPER QUADRANT COMPARISON:  None in PACs FINDINGS: Gallbladder: The gallbladder is adequately distended. There are echogenic mobile partially shadowing structures within the gallbladder compatible with stones  with the largest measuring 1.3 cm. There is mild gallbladder wall thickening to have 3.8 mm. There is no positive sonographic Murphy's sign nor is there pericholecystic fluid. Common bile duct: Diameter: 6 mm.  No intraluminal stones are observed. Liver: The hepatic echotexture is mildly increased. It was difficult to penetrate the organ with the ultrasound beam. No discrete mass or ductal dilation is observed. The limited visualization of the right kidney reveals mildly increased cortical echotexture. IMPRESSION: Gallstones with mild  gallbladder wall thickening but no positive sonographic Murphy's sign to suggest acute cholecystitis. Normal appearing common bile duct. Increased hepatic echotexture consistent with known cirrhosis. Mildly increased cortical echotexture of the partially imaged right kidney may reflect medical renal disease. Electronically Signed   By: [name] M.D.   On: 10/26/2016 10:11   Medications: I have reviewed the patient's current medications. Scheduled Meds: . enoxaparin (LOVENOX) injection  40 mg Subcutaneous Q24H  . levothyroxine  88 mcg Oral Daily  . losartan  25 mg Oral Daily  . metoprolol tartrate  12.5 mg Oral BID   Continuous Infusions: . sodium chloride 75 mL/hr at 10/27/16 1149   PRN Meds:.albuterol, ondansetron **OR** ondansetron (ZOFRAN) IV, senna-docusate   Assessment: Active Problems:   Abnormal LFTs  Christy Mendoza is a 81 y.o. y/o female presented to the ER after a fall and was noted to have elevated transaminases. The pattern of elevattion can be seen in hepatitis from ischemia, viral or toxins. It is extremely rare for Gabapentin to cause this degree of hepatitis but Valproic acid can cause the same.   Plan 1. Monitor LFT's and if continues to drop tomorrow, should be able to go home with outpatient follow up with repeat labs in 2-3 days time, Iwould be happy to see her in the outpatient.  2. Consider D/c Valproic acid and use alternative Rx for bipolar.  3. Follow up pending labs- so far no abnormal viral serologies .     LOS: 1 day   Jonathon Bellows 10/27/2016, 3:07 PM

## 2016-10-28 LAB — VARICELLA-ZOSTER BY PCR: VARICELLA-ZOSTER, PCR: NEGATIVE

## 2016-10-28 LAB — COMPREHENSIVE METABOLIC PANEL
ALBUMIN: 3.1 g/dL — AB (ref 3.5–5.0)
ALT: 481 U/L — ABNORMAL HIGH (ref 14–54)
ANION GAP: 4 — AB (ref 5–15)
AST: 422 U/L — ABNORMAL HIGH (ref 15–41)
Alkaline Phosphatase: 123 U/L (ref 38–126)
BUN: 14 mg/dL (ref 6–20)
CALCIUM: 8.6 mg/dL — AB (ref 8.9–10.3)
CHLORIDE: 118 mmol/L — AB (ref 101–111)
CO2: 22 mmol/L (ref 22–32)
Creatinine, Ser: 0.66 mg/dL (ref 0.44–1.00)
GFR calc non Af Amer: >60 mL/min (ref >60)
Glucose, Bld: 91 mg/dL (ref 65–99)
POTASSIUM: 3.1 mmol/L — AB (ref 3.5–5.1)
SODIUM: 144 mmol/L (ref 135–145)
Total Bilirubin: 4.7 mg/dL — ABNORMAL HIGH (ref 0.3–1.2)
Total Protein: 5.9 g/dL — ABNORMAL LOW (ref 6.5–8.1)

## 2016-10-28 LAB — HEPATITIS B E ANTIBODY: Hep B E Ab: NEGATIVE

## 2016-10-28 MED ORDER — POTASSIUM CHLORIDE CRYS ER 20 MEQ PO TBCR
40.0000 meq | EXTENDED_RELEASE_TABLET | Freq: Once | ORAL | Status: AC
  Administered 2016-10-28: 07:00:00 40 meq via ORAL
  Filled 2016-10-28: qty 2

## 2016-10-28 MED ORDER — GABAPENTIN 600 MG PO TABS: 600.0000 mg | ORAL_TABLET | Freq: Every day | ORAL | 0 refills | Status: AC

## 2016-10-28 MED ORDER — TRAMADOL HCL 50 MG PO TABS
50.0000 mg | ORAL_TABLET | Freq: Once | ORAL | Status: AC
  Administered 2016-10-28: 16:00:00 50 mg via ORAL
  Filled 2016-10-28: qty 1

## 2016-10-28 MED ORDER — GABAPENTIN 300 MG PO CAPS
300.0000 mg | ORAL_CAPSULE | Freq: Two times a day (BID) | ORAL | Status: DC
  Administered 2016-10-28 – 2016-10-29 (×2): 300 mg via ORAL
  Filled 2016-10-28 (×2): qty 1

## 2016-10-28 NOTE — Clinical Social Work Note (Signed)
CSW was updated by RN and family that pt has been reevaluated by PT and they recommendation is HHPT. Pt will discharge home tomorrow with home health services. However, SNF is still the back up. CSW will also updated RNCM. CSW will continue to follow.   Darden Dates, MSW, LCSW  Clinical Social Worker  [phone]

## 2016-10-28 NOTE — Progress Notes (Signed)
[place] at [place] NAME: Christy Mendoza    MR#:  [id number]  DATE OF BIRTH:  July 19, 1935  SUBJECTIVE:  Patient doing well thi am No nausea wants to sleep REVIEW OF SYSTEMS:    Review of Systems  Constitutional: Negative for fever, chills weight loss HENT: Negative for ear pain, nosebleeds, congestion, facial swelling, rhinorrhea, neck pain, neck stiffness and ear discharge.   Respiratory: Negative for cough, shortness of breath, wheezing  Cardiovascular: Negative for chest pain, palpitations and leg swelling.  Gastrointestinal: Negative for heartburn, abdominal pain, vomiting, diarrhea or consitpation Genitourinary: Negative for dysuria, urgency, frequency, hematuria Musculoskeletal: Negative for back pain or joint pain Neurological: Negative for dizziness, seizures, syncope, focal weakness,  numbness and headaches.  Hematological: Does not bruise/bleed easily.  Psychiatric/Behavioral: Negative for hallucinations,  +some confusion, dysphoric mood    Tolerating Diet:yes      DRUG ALLERGIES:   Allergies  Allergen Reactions  . Hydrochlorothiazide Shortness Of Breath  . Lisinopril Shortness Of Breath  . Cephalexin Other (See Comments)    VITALS:  Blood pressure (!) 162/60, pulse (!) 55, temperature 98.3 F (36.8 C), temperature source Oral, resp. rate 20, height 5\' 5"  (1.651 m), weight 85.8 kg (189 lb 2 oz), SpO2 94 %.  PHYSICAL EXAMINATION:  Constitutional: Appears well-developed and well-nourished. No distress. HENT: Normocephalic. [name] Oropharynx is clear and moist.  Eyes: Conjunctivae and EOM are normal. PERRLA, no scleral icterus.  Neck: Normal ROM. Neck supple. No JVD. No tracheal deviation. CVS: RRR, S1/S2 +, no murmurs, no gallops, no carotid bruit.  Pulmonary: Effort and breath sounds normal, no stridor, rhonchi, wheezes, rales.  Abdominal: Soft. BS +,  no distension, tenderness, rebound or guarding.  Musculoskeletal:  Normal range of motion. No edema and no tenderness.  Neuro: Alert. CN 2-12 grossly intact. No focal deficits. Oriented to name and place says 1916 Skin: Skin is warm and dry. No rash noted. Psychiatric: Normal mood and affect.      LABORATORY PANEL:   CBC  Recent Labs Lab 10/27/16 0525  WBC 11.7*  HGB 13.3  HCT 39.8  PLT 140*   ------------------------------------------------------------------------------------------------------------------  Chemistries   Recent Labs Lab 10/28/16 0540  NA 144  K 3.1*  CL 118*  CO2 22  GLUCOSE 91  BUN 14  CREATININE 0.66  CALCIUM 8.6*  AST 422*  ALT 481*  ALKPHOS 123  BILITOT 4.7*   ------------------------------------------------------------------------------------------------------------------  Cardiac Enzymes  Recent Labs Lab 10/26/16 0817  TROPONINI <0.03   ------------------------------------------------------------------------------------------------------------------  RADIOLOGY:  No results found.   ASSESSMENT AND PLAN:   81 y/o Female with history of bipolar affect disorder, breast cancer and essential hypertension who presented with weakness and found to have elevated LFTs.   1. Elevated liver function tests due to Depakote Depakote has been discontinued LFTs trending down but will be rechecked in am. Abdominal ultrasound shows gallstone but no evidence of acute cholecystitis or dilated bile ducts Appreciate GI consult  2. Acute metabolic encephalopathy; improving  3. Liver cirrhosis: Patient will need GI follow-up 4. Hypothyroid: Continue Synthroid  5. Essential hypertension: Continue Cozaar and metoprolol  6. Bipolar affect disorder: Psychiatry consult appreciated.  Management plans discussed with the patient and daughter and she is in agreement.  CODE STATUS: FULL  TOTAL TIME TAKING CARE OF THIS PATIENT: 25 minutes.   D/w nurse   POSSIBLE D/C 1-2 days, DEPENDING ON CLINICAL  CONDITION.   Zyia Kaneko M.D on 10/28/2016 at 12:43 PM  Between 7am to 6pm - Pager - [phone] After 6pm go to [email or web address] - password [email or web address] [facility]  Office  [phone]  CC: Primary care physician; Tracie Harrier, MD  Note: This dictation was prepared with Dragon dictation along with smaller phrase technology. Any transcriptional errors that result from this process are unintentional.

## 2016-10-28 NOTE — Clinical Social Work Placement (Signed)
   CLINICAL SOCIAL WORK PLACEMENT  NOTE  Date:  10/28/2016  Patient Details  Name: Kimmy Totten MRN: [id number] Date of Birth: [birth year]  Clinical Social Work is seeking post-discharge placement for this patient at the [place] level of care (*CSW will initial, date and re-position this form in  chart as items are completed):  Yes   Patient/family provided with [facility] [place] [facility] Work Department's list of facilities offering this level of care within the geographic area requested by the patient (or if unable, by the patient's family).  Yes   Patient/family informed of their freedom to choose among providers that offer the needed level of care, that participate in Medicare, Medicaid or managed care program needed by the patient, have an available bed and are willing to accept the patient.  Yes   Patient/family informed of [facility]'s ownership interest in [facility] and [facility], as well as of the fact that they are under no obligation to receive care at these facilities.  PASRR submitted to EDS on 10/28/16     PASRR number received on 10/28/16     Existing PASRR number confirmed on       FL2 transmitted to all facilities in geographic area requested by pt/family on 10/28/16     FL2 transmitted to all facilities within larger geographic area on       Patient informed that his/her managed care company has contracts with or will negotiate with certain facilities, including the following:            Patient/family informed of bed offers received.  Patient chooses bed at       Physician recommends and patient chooses bed at      Patient to be transferred to   on  .  Patient to be transferred to facility by       Patient family notified on   of transfer.  Name of family member notified:        PHYSICIAN       Additional Comment:    _______________________________________________ Darden Dates, LCSW 10/28/2016, 3:59 PM

## 2016-10-28 NOTE — Care Management (Signed)
Patient discharging to home today. I spoke with patient's daughter and she would like to use [facility] for home health. MD paged to change order from walker to Bedside commode. Corene Cornea with [facility] to deliver bedside commode and for home health discharge today. Bedside commode will be delivered to patient prior to discharge. Daughter concerned that patient has not walked and her ability to manage patient from hospital to home. RN notified.

## 2016-10-28 NOTE — Discharge Summary (Addendum)
[place] at [place] NAME: Christy Mendoza    MR#:  [id number]  DATE OF BIRTH:  1936-04-05  DATE OF ADMISSION:  10/26/2016 ADMITTING PHYSICIAN: Demetrios Loll, MD  DATE OF DISCHARGE: 10/29/2016  PRIMARY CARE PHYSICIAN: Tracie Harrier, MD    ADMISSION DIAGNOSIS:  Acute hepatitis [B17.9] Elevated LFTs [R79.89] Abnormal LFTs [R94.5]  DISCHARGE DIAGNOSIS:   Active Problems:   Abnormal LFTs from Depakote    SECONDARY DIAGNOSIS:   Past Medical History:  Diagnosis Date  . Adrenal adenoma   . Atherosclerosis of aorta ([place])   . Atrial fibrillation ([place])   . Bipolar 1 disorder ([place])   . Cancer ([place])    left breast  . Cholelithiasis   . Hypertension   . Osteoarthritis     HOSPITAL COURSE:   81 y/o Female with history of bipolar affect disorder, breast cancer and essential hypertension who presented with weakness and found to have elevated LFTs.   1. Elevated liver function tests due to Depakote Depakote has been discontinuedPermanently. LFTs Are trending downAbdominal ultrasound shows gallstone but no evidence of acute cholecystitis or dilated bile ducts Hepatitis panel is negative. Patient was evaluated by gastroenterology. Recommendations since LFTs are trending down are to repeat LFTs on Friday and then again in 1 week. Within that timeframe patient will follow up with PCP and GI.   2. Acute metabolic encephalopathy: This is improved. 3. Liver cirrhosis: Patient will need GI follow-up which will be arranged within the next week. 4. Hypothyroid: Continue Synthroid  5. Essential hypertension: Continue Cozaar and metoprolol  6. Bipolar affect disorder: Patient was evaluated by psychiatry while in the hospital. At this time daughter does not want patient to start on any medications. Patient will have close follow-up with her PCP.   DISCHARGE CONDITIONS AND DIET:   Patient is stable for discharge on heart healthy diet  CONSULTS  OBTAINED:  Treatment Team:  Gonzella Lex, MD  DRUG ALLERGIES:   Allergies  Allergen Reactions  . Hydrochlorothiazide Shortness Of Breath  . Lisinopril Shortness Of Breath  . Cephalexin Other (See Comments)    DISCHARGE MEDICATIONS:   Current Discharge Medication List    CONTINUE these medications which have CHANGED   Details  gabapentin (NEURONTIN) 600 MG tablet Take 1 tablet (600 mg total) by mouth at bedtime. Refills: 0      CONTINUE these medications which have NOT CHANGED   Details  aspirin 81 MG chewable tablet Chew 81 mg by mouth daily.    folic acid (FOLVITE) 1 MG tablet Take 1 mg by mouth 2 (two) times daily.    levothyroxine (SYNTHROID, LEVOTHROID) 88 MCG tablet Take 88 mcg by mouth daily. Refills: 3    losartan (COZAAR) 25 MG tablet Take 25 mg by mouth daily. Refills: 3    metoprolol tartrate (LOPRESSOR) 25 MG tablet Take 12.5 mg by mouth 2 (two) times daily. Refills: 3    vitamin B-12 (CYANOCOBALAMIN) 500 MCG tablet Take 500 mcg by mouth 2 (two) times daily.      STOP taking these medications     valproic acid (DEPAKENE) 250 MG capsule           Today   CHIEF COMPLAINT:  No acute events overnight. Patient tolerated diet without any abdominal pain, nausea or vomiting. Patient reports no shortness of breath.  VITAL SIGNS:  Blood pressure (!) 148/62, pulse (!) 54, temperature 98.6 F (37 C), temperature source Oral, resp. rate 18, height 5\' 5"  (  1.651 m), weight 85.8 kg (189 lb 2 oz), SpO2 92 %.   REVIEW OF SYSTEMS:  Review of Systems  Constitutional: Negative.  Negative for chills, fever and malaise/fatigue.  HENT: Negative.  Negative for ear discharge, ear pain, hearing loss, nosebleeds and sore throat.   Eyes: Negative.  Negative for blurred vision and pain.  Respiratory: Negative.  Negative for cough, hemoptysis, shortness of breath and wheezing.   Cardiovascular: Negative.  Negative for chest pain, palpitations and leg swelling.   Gastrointestinal: Negative.  Negative for abdominal pain, blood in stool, diarrhea, nausea and vomiting.  Genitourinary: Negative.  Negative for dysuria.  Musculoskeletal: Negative.  Negative for back pain.  Skin: Negative.   Neurological: Negative for dizziness, tremors, speech change, focal weakness, seizures and headaches.  Endo/Heme/Allergies: Negative.  Does not bruise/bleed easily.  Psychiatric/Behavioral: Negative.  Negative for depression, hallucinations and suicidal ideas.     PHYSICAL EXAMINATION:  GENERAL:  81 y.o.-year-old patient lying in the bed with no acute distress.  NECK:  Supple, no jugular venous distention. No thyroid enlargement, no tenderness.  LUNGS: Normal breath sounds bilaterally, no wheezing, rales,rhonchi  No use of accessory muscles of respiration.  CARDIOVASCULAR: S1, S2 normal. No murmurs, rubs, or gallops.  ABDOMEN: Soft, non-tender, non-distended. Bowel sounds present. No organomegaly or mass.  EXTREMITIES: No pedal edema, cyanosis, or clubbing.  PSYCHIATRIC: The patient is alert and oriented x 3.  SKIN: No obvious rash, lesion, or ulcer.   DATA REVIEW:   CBC  Recent Labs Lab 10/27/16 0525  WBC 11.7*  HGB 13.3  HCT 39.8  PLT 140*    Chemistries   Recent Labs Lab 10/29/16 0436  NA 144  K 3.3*  CL 115*  CO2 23  GLUCOSE 96  BUN 12  CREATININE 0.71  CALCIUM 9.0  AST 298*  ALT 449*  ALKPHOS 140*  BILITOT 3.2*    Cardiac Enzymes  Recent Labs Lab 10/26/16 0817  TROPONINI <0.03    Microbiology Results  @MICRORSLT48 @  RADIOLOGY:  No results found.    Current Discharge Medication List    CONTINUE these medications which have CHANGED   Details  gabapentin (NEURONTIN) 600 MG tablet Take 1 tablet (600 mg total) by mouth at bedtime. Refills: 0      CONTINUE these medications which have NOT CHANGED   Details  aspirin 81 MG chewable tablet Chew 81 mg by mouth daily.    folic acid (FOLVITE) 1 MG tablet Take 1 mg by  mouth 2 (two) times daily.    levothyroxine (SYNTHROID, LEVOTHROID) 88 MCG tablet Take 88 mcg by mouth daily. Refills: 3    losartan (COZAAR) 25 MG tablet Take 25 mg by mouth daily. Refills: 3    metoprolol tartrate (LOPRESSOR) 25 MG tablet Take 12.5 mg by mouth 2 (two) times daily. Refills: 3    vitamin B-12 (CYANOCOBALAMIN) 500 MCG tablet Take 500 mcg by mouth 2 (two) times daily.      STOP taking these medications     valproic acid (DEPAKENE) 250 MG capsule           Management plans discussed with the patient and daughter (last evening) and she is in agreement. Stable for discharge home with [facility]  Patient should follow up with pcp  CODE STATUS:     Code Status Orders        Start     Ordered   10/26/16 1207  Full code  Continuous     10/26/16 1206  Code Status History    Date Active Date Inactive Code Status Order ID Comments User Context   This patient has a current code status but no historical code status.      TOTAL TIME TAKING CARE OF THIS PATIENT: 38 minutes.    Note: This dictation was prepared with Dragon dictation along with smaller phrase technology. Any transcriptional errors that result from this process are unintentional.  Jaidan Prevette M.D on 10/29/2016 at 6:51 AM  Between 7am to 6pm - Pager - [phone] After 6pm go to [email or web address] - password [email or web address] [facility]  Office  [phone]  CC: Primary care physician; Tracie Harrier, MD

## 2016-10-28 NOTE — NC FL2 (Signed)
  [place] LEVEL OF CARE SCREENING TOOL     IDENTIFICATION  Patient Name: Christy Mendoza Birthdate: [birth year] Sex: female Admission Date (Current Location): 10/26/2016  [place] and [place] Number:  Engineering geologist and Address:  [facility], [address], [place], [place] [address]      Provider Number: [id number]  Attending Physician Name and Address:  Bettey Costa, MD  Relative Name and Phone Number:       Current Level of Care: Hospital Recommended Level of Care: [place] Prior Approval Number:    Date Approved/Denied:   PASRR Number: [id number]  Discharge Plan: SNF    Current Diagnoses: Patient Active Problem List   Diagnosis Date Noted  . History of bipolar disorder 10/27/2016  . Insomnia 10/27/2016  . Abnormal LFTs 10/26/2016    Orientation RESPIRATION BLADDER Height & Weight     Self, Situation, Place  Normal Incontinent Weight: 189 lb 2 oz (85.8 kg) Height:  5\' 5"  (165.1 cm)  BEHAVIORAL SYMPTOMS/MOOD NEUROLOGICAL BOWEL NUTRITION STATUS      Incontinent Diet (Regular Diet, Thin Liquids)  AMBULATORY STATUS COMMUNICATION OF NEEDS Skin   Extensive Assist Verbally Normal                       Personal Care Assistance Level of Assistance  Bathing, Feeding, Dressing Bathing Assistance: Limited assistance Feeding assistance: Independent Dressing Assistance: Limited assistance     Functional Limitations Info  Sight, Hearing, Speech Sight Info: Adequate Hearing Info: Adequate Speech Info: Adequate    SPECIAL CARE FACTORS FREQUENCY  PT (By licensed PT)     PT Frequency: 5              Contractures Contractures Info: Not present    Additional Factors Info  Code Status, Allergies Code Status Info: Full Code Allergies Info: Hydrochlorothiazide, Lisinopril, Cephalexin           Current Medications (10/28/2016):  This is the current hospital active medication list Current  Facility-Administered Medications  Medication Dose Route Frequency Provider Last Rate Last Dose  . 0.9 %  sodium chloride infusion   Intravenous Continuous Demetrios Loll, MD 75 mL/hr at 10/28/16 1453    . albuterol (PROVENTIL) (2.5 MG/3ML) 0.083% nebulizer solution 2.5 mg  2.5 mg Nebulization Q2H PRN Demetrios Loll, MD      . enoxaparin (LOVENOX) injection 40 mg  40 mg Subcutaneous Q24H Demetrios Loll, MD   40 mg at 10/27/16 2119  . levothyroxine (SYNTHROID, LEVOTHROID) tablet 88 mcg  88 mcg Oral Daily Demetrios Loll, MD   88 mcg at 10/28/16 0535  . losartan (COZAAR) tablet 25 mg  25 mg Oral Daily Demetrios Loll, MD   25 mg at 10/28/16 1139  . metoprolol tartrate (LOPRESSOR) tablet 12.5 mg  12.5 mg Oral BID Demetrios Loll, MD   12.5 mg at 10/28/16 1139  . ondansetron (ZOFRAN) tablet 4 mg  4 mg Oral Q6H PRN Demetrios Loll, MD       Or  . ondansetron [facility]) injection 4 mg  4 mg Intravenous Q6H PRN Demetrios Loll, MD      . senna-docusate (Senokot-S) tablet 1 tablet  1 tablet Oral QHS PRN Demetrios Loll, MD         Discharge Medications: Please see discharge summary for a list of discharge medications.  Relevant Imaging Results:  Relevant Lab Results:   Additional Information SSN:  [id number]  Darden Dates, LCSW

## 2016-10-28 NOTE — Care Management (Addendum)
Notified by RN that patient is very weak and she was afraid to attempt to get patient up. RNCM requested that PT work with patient however it is uncertain that this will change discharge recommendation per RN after speaking with PT.  RN said that daughter is now considering SNF. RN notified CSW. CSW contacted this RNCM and we discussed. RNCM will continue to follow. RNCM discussed with MD and MD states that labs should be monitored; MD aware of RN safety concern also for discharge to home.  I have notified Corene Cornea with [facility] that Bedside commode may need to be retrieved as patient may go to SNF for rehab. Attempted to contact Dianna/daughter to discuss but there was no answer. Rounded on 1C/room 126 and daughter/son in law was in room. She does agrees with bed search for snf and states she didn't understand that PT was recommending SNF "all she heard was rehab in the home or SNF and she thought she had a choice". I explained that she does still have a choice but it is recommended that patient go to SNF for rehab. Daughter is aware that Bedside commode would need to be retrieved by supplier if patient goes to SNF.  CSW rounded during my visit and I dismissed myself.

## 2016-10-28 NOTE — Progress Notes (Signed)
Physical Therapy Treatment Patient Details Name: Christy Mendoza MRN: [id number] DOB: [birth year] Today's Date: 10/28/2016    History of Present Illness 81 y.o. female with a known history of bipolar disorder, breast cancer and hypertension. Patient was sent to ED due to fall at home, Unable to get up. She was found weak, confused and drowsy.  She denies any syncope, seizure, loss of consciousness or incontinence (pt was incontinent during PT exam).    PT Comments    Pt is making good progress towards goals with improvement noted in functional mobility. Able to change disposition to HHPT at this time secondary to increased functional independence. Still needs physical assist for bed mobility and will need 24/7 care at this time. Family able to meet needs safely at home and pt is close to baseline level. Will continue to progress.   Follow Up Recommendations  Home health PT;Supervision/Assistance - 24 hour     Equipment Recommendations       Recommendations for Other Services       Precautions / Restrictions Precautions Precautions: Fall Restrictions Weight Bearing Restrictions: No    Mobility  Bed Mobility Overal bed mobility: Needs Assistance Bed Mobility: Supine to Sit;Sit to Supine     Supine to sit: Mod assist Sit to supine: Min assist   General bed mobility comments: assist for sequencing. Needs help for trunk support. Takes increased time for performance. Once seated at EOB, able to sit with upright posture. Takes increased time for transition back to supine and needs assist for scooting up towards [place].  Transfers Overall transfer level: Needs assistance Equipment used: Rolling walker (2 wheeled) Transfers: Sit to/from Stand Sit to Stand: Min assist         General transfer comment: able to stand with correct technique pushing from bed. Once standing, able to use RW correctly. No unsteadiness noted  Ambulation/Gait Ambulation/Gait assistance: Min guard Ambulation  Distance (Feet): 40 Feet Assistive device: Rolling walker (2 wheeled) Gait Pattern/deviations: Step-to pattern     General Gait Details: Slow step to gait pattern performed, however pt generally does well with limited ambulation. Needs cues for keeping close to RW. Fatigues with increased distance and request to return back to bed   Stairs            Wheelchair Mobility    Modified Rankin (Stroke Patients Only)       Balance                                            Cognition Arousal/Alertness: Awake/alert Behavior During Therapy: WFL for tasks assessed/performed Overall Cognitive Status: History of cognitive impairments - at baseline                                        Exercises Other Exercises Other Exercises: Pt with incontinent episode in bed. Takes increased time for rolling, hygiene, and changing linen. Takes +2 for assistance.    General Comments        Pertinent Vitals/Pain Pain Assessment: Faces Faces Pain Scale: Hurts a little bit Pain Location: "all over" Pain Descriptors / Indicators: Aching;Constant;Dull Pain Intervention(s): Limited activity within patient's tolerance;Repositioned    Home Living  Prior Function            PT Goals (current goals can now be found in the care plan section) Acute Rehab PT Goals Patient Stated Goal: go home PT Goal Formulation: With patient Time For Goal Achievement: 11/10/16 Potential to Achieve Goals: Fair Progress towards PT goals: Progressing toward goals    Frequency    Min 2X/week      PT Plan Current plan remains appropriate    Co-evaluation              AM-PAC PT "6 Clicks" Daily Activity  Outcome Measure  Difficulty turning over in bed (including adjusting bedclothes, sheets and blankets)?: Total Difficulty moving from lying on back to sitting on the side of the bed? : Total Difficulty sitting down on and standing  up from a chair with arms (e.g., wheelchair, bedside commode, etc,.)?: Total Help needed moving to and from a bed to chair (including a wheelchair)?: A Little Help needed walking in hospital room?: A Little Help needed climbing 3-5 steps with a railing? : A Lot 6 Click Score: 11    End of Session Equipment Utilized During Treatment: Gait belt Activity Tolerance: Patient tolerated treatment well Patient left: with call bell/phone within reach;with bed alarm set Nurse Communication: Mobility status PT Visit Diagnosis: Muscle weakness (generalized) (M62.81);Difficulty in walking, not elsewhere classified (R26.2)     Time: 2395-3202 PT Time Calculation (min) (ACUTE ONLY): 47 min  Charges:  $Gait Training: 8-22 mins $Therapeutic Activity: 23-37 mins                    G Codes:       Greggory Stallion, PT, DPT [phone]    Yahel Fuston 10/28/2016, 5:24 PM

## 2016-10-28 NOTE — Progress Notes (Signed)
This nurse made aware by case management that patient's daughter had concerns about whether patient could stand or even walk once home.  This nurse asked by case management to get patient up out of bed.  This nurse felt this was not a safety option as physical therapy had elevated patient previous day and SNF was recommended as patient was only able to stand for 1-2 minutes and only walked 4 feet.  Patient was a heavy max assist at that time.  Forde Dandy was had with daughter explaining that even if we were able to get patient up that didn't necessarily mean that they would be able to do that at home.  Patient's daughter agreed that SNF may be a better, safer option.  Patient's liver function is still rather high as well.  Social worker, MD, and case mangement all updated.  PT contacted to do a re-evaluation on patient to see if her strength has possibly improved.  Patient did much better today working with PT and can now safely go home in the morning with HHPT.  Daughter and son-in-law present entire time PT working with patient and are much more comfortable with patient discharging home tomorrow.  Clarise Cruz, RN

## 2016-10-28 NOTE — Clinical Social Work Note (Signed)
Clinical Social Work Assessment  Patient Details  Name: Christy Mendoza MRN: [id number] Date of Birth: 07/27/1935  Date of referral:  10/28/16               Reason for consult:  Facility Placement, Discharge Planning                Permission sought to share information with:  Family Supports Permission granted to share information::  Yes, Verbal Permission Granted  Name::     Dianna Wood  Relationship::  daughter  Contact Information:  [phone]  Housing/Transportation Living arrangements for the past 2 months:  Single Family Home Source of Information:  Patient, Adult Children Patient Interpreter Needed:  None Criminal Activity/Legal Involvement Pertinent to Current Situation/Hospitalization:  No - Comment as needed Significant Relationships:  Adult Children Lives with:  Self, Adult Children Do you feel safe going back to the place where you live?  Yes Need for family participation in patient care:  Yes (Comment)  Care giving concerns:  No care giving concerns identified.   Social Worker assessment / plan:  CSW met with pt and family to address consult for New SNF. Pt's discharge has been cancelled and more labs ordered. CSW introduced herself and explained role of social work. CSW also explained the process of discharging to SNF. CSW addressed the reasons why SNF has been recommended.   Pt lives in a suite with her daughter and son in law. Pt has been independent prior to admission, however is weak at baseline, per daughter.   Pt and family were interested in SNF placement. CSW initiated SNF search and will follow up with bed offers. CSW will continue to follow.   Employment status:  Retired Insurance information:  Medicare PT Recommendations:  Skilled Nursing Facility Information / Referral to community resources:  Skilled Nursing Facility  Patient/Family's Response to care:  Pt and daughter and son in law were appreciative of CSW support.   Patient/Family's Understanding of  and Emotional Response to Diagnosis, Current Treatment, and Prognosis:  Pt and family understand the benefit of STR prior to returning home.   Emotional Assessment Appearance:  Appears stated age Attitude/Demeanor/Rapport:   (Appropriate) Affect (typically observed):  Accepting, Adaptable, Pleasant Orientation:  Oriented to Self, Oriented to Place, Oriented to Situation Alcohol / Substance use:  Not Applicable Psych involvement (Current and /or in the community):  Yes (Comment)  Discharge Needs  Concerns to be addressed:  Adjustment to Illness Readmission within the last 30 days:  No Current discharge risk:  Chronically ill Barriers to Discharge:  Continued Medical Work up    , LCSW 10/28/2016, 4:03 PM  

## 2016-10-28 NOTE — Consult Note (Signed)
[facility] Consult   Reason for Consult:  Consult for 81 year old woman who came to the hospital after a fall and was found to have elevated liver enzymes. Question about psychiatric medication. Referring Physician:  Mody Patient Identification: Christy Mendoza MRN:  [id number] Principal Diagnosis: History of bipolar disorder Diagnosis:   Patient Active Problem List   Diagnosis Date Noted  . History of bipolar disorder [Z86.59] 10/27/2016  . Insomnia [G47.00] 10/27/2016  . Abnormal LFTs [R94.5] 10/26/2016    Total Time spent with patient: 15 minutes  Subjective:   Christy Mendoza is a 81 y.o. female patient admitted with "I got dizzy".  Follow-up for May the second. Patient seen. She was eating supper. Had no new complaints. Patient was oriented to her situation. She tells me that she is planning to be discharged tomorrow and go back home to live at her daughter's house. Judging from the recent notes I am not clear if that was really the plan or if she was still possibly going to go to skilled nursing. Patient did not otherwise seem to be confused. Affect was reactive. I note that she still has very elevated liver enzymes but they are at least trending down. No indication to add any new medication or psychiatric intervention.  HPI:  Patient interviewed. Chart reviewed. Patient's daughter was in the room and dominated the conversation giving the history although I was able to get some information directly from the patient. This is a 81 year old woman came to the hospital after a fall at home. She was found to have elevated liver enzymes and abnormal liver function tests which are being worked up. One likely etiology would be her chronic use of valproic acid. Patient currently reports that she is not back to feeling normal. She has a hard time articulating exactly how she is feeling. Does not report feeling depressed or having any significant emotional change. Daughter reports that the  patient's current mental status is abnormal for her. At baseline the patient is reported to be mentally alert and sharp and engaged whereas this point she seems to be slowed down. There had been no report however of any depressive symptoms or psychiatric symptoms prior to hospitalization. Patient has had long-standing chronic complaints of insomnia which should been somewhat worse recently. No report of any psychotic symptoms.  Social history: Patient lives in an apartment connected to the home of her daughter and the daughter's family. All of them recently relocated from [place] to [place] and have been here for about a year. Daughter is clearly very involved and concerned in her mother's care.  Medical history: Patient does have a history of some significant medical problems in the past including breast cancer adrenal insufficiency atherosclerosis atrial fibrillation. Has a primary care doctor in the area.  Substance abuse history: None reported  Past Psychiatric History: Patient reportedly had been diagnosed with bipolar disorder many years ago. All of her prior treatment occurred in [place]. The patient herself is not able to give much detail. Daughter reports that the patient did have psychiatric problems which mostly seemed to be depression in her younger years. She had hospitalization but this was also decades ago. The daughter is of the opinion that the diagnosis of bipolar disorder was largely based on problems with sleep. Daughter believes that the patient had never had a manic episode. Currently the patient's mental status is impaired to the point that it is difficult for her to give history. Also when I attempted to engage the  patient in some conversation about her past psychiatric history the daughter interrupted and insisted that these kinds of questions were not appropriate.  Risk to Self: Is patient at risk for suicide?: No Risk to Others:   Prior Inpatient Therapy:   Prior  Outpatient Therapy:    Past Medical History:  Past Medical History:  Diagnosis Date  . Adrenal adenoma   . Atherosclerosis of aorta ([place])   . Atrial fibrillation ([place])   . Bipolar 1 disorder ([place])   . Cancer ([place])    left breast  . Cholelithiasis   . Hypertension   . Osteoarthritis     Past Surgical History:  Procedure Laterality Date  . BREAST LUMPECTOMY Left 2013  . HERNIA REPAIR  2010  . PARATHYROIDECTOMY  1996   Family History: History reviewed. No pertinent family history. Family Psychiatric  History: Unknown Social History:  History  Alcohol Use No     History  Drug Use No    Social History   Social History  . Marital status: Divorced    Spouse name: N/A  . Number of children: N/A  . Years of education: N/A   Social History Main Topics  . Smoking status: Never Smoker  . Smokeless tobacco: Never Used  . Alcohol use No  . Drug use: No  . Sexual activity: Not Asked   Other Topics Concern  . None   Social History Narrative  . None   Additional Social History:    Allergies:   Allergies  Allergen Reactions  . Hydrochlorothiazide Shortness Of Breath  . Lisinopril Shortness Of Breath  . Cephalexin Other (See Comments)    Labs:  Results for orders placed or performed during the hospital encounter of 10/26/16 (from the past 48 hour(s))  CBC     Status: Abnormal   Collection Time: 10/27/16  5:25 AM  Result Value Ref Range   WBC 11.7 (H) 3.6 - 11.0 K/uL   RBC 4.27 3.80 - 5.20 MIL/uL   Hemoglobin 13.3 12.0 - 16.0 g/dL   HCT 39.8 35.0 - 47.0 %   MCV 93.2 80.0 - 100.0 fL   MCH 31.0 26.0 - 34.0 pg   MCHC 33.3 32.0 - 36.0 g/dL   RDW 14.3 11.5 - 14.5 %   Platelets 140 (L) 150 - 440 K/uL  Comprehensive metabolic panel     Status: Abnormal   Collection Time: 10/27/16  5:25 AM  Result Value Ref Range   Sodium 148 (H) 135 - 145 mmol/L   Potassium 3.6 3.5 - 5.1 mmol/L   Chloride 120 (H) 101 - 111 mmol/L   CO2 22 22 - 32 mmol/L   Glucose, Bld 91 65 -  99 mg/dL   BUN 18 6 - 20 mg/dL   Creatinine, Ser 0.79 0.44 - 1.00 mg/dL   Calcium 8.9 8.9 - 10.3 mg/dL   Total Protein 6.2 (L) 6.5 - 8.1 g/dL   Albumin 3.3 (L) 3.5 - 5.0 g/dL   AST 550 (H) 15 - 41 U/L   ALT 574 (H) 14 - 54 U/L   Alkaline Phosphatase 109 38 - 126 U/L   Total Bilirubin 5.1 (H) 0.3 - 1.2 mg/dL   GFR calc non Af Amer >60 >60 mL/min   GFR calc Af Amer >60 >60 mL/min    Comment: (NOTE) The eGFR has been calculated using the CKD EPI equation. This calculation has not been validated in all clinical situations. eGFR's persistently <60 mL/min signify possible Chronic Kidney Disease.  Anion gap 6 5 - 15  Comprehensive metabolic panel     Status: Abnormal   Collection Time: 10/28/16  5:40 AM  Result Value Ref Range   Sodium 144 135 - 145 mmol/L   Potassium 3.1 (L) 3.5 - 5.1 mmol/L   Chloride 118 (H) 101 - 111 mmol/L   CO2 22 22 - 32 mmol/L   Glucose, Bld 91 65 - 99 mg/dL   BUN 14 6 - 20 mg/dL   Creatinine, Ser 0.66 0.44 - 1.00 mg/dL   Calcium 8.6 (L) 8.9 - 10.3 mg/dL   Total Protein 5.9 (L) 6.5 - 8.1 g/dL   Albumin 3.1 (L) 3.5 - 5.0 g/dL   AST 422 (H) 15 - 41 U/L   ALT 481 (H) 14 - 54 U/L   Alkaline Phosphatase 123 38 - 126 U/L   Total Bilirubin 4.7 (H) 0.3 - 1.2 mg/dL   GFR calc non Af Amer >60 >60 mL/min   GFR calc Af Amer >60 >60 mL/min    Comment: (NOTE) The eGFR has been calculated using the CKD EPI equation. This calculation has not been validated in all clinical situations. eGFR's persistently <60 mL/min signify possible Chronic Kidney Disease.    Anion gap 4 (L) 5 - 15    Current Facility-Administered Medications  Medication Dose Route Frequency Provider Last Rate Last Dose  . 0.9 %  sodium chloride infusion   Intravenous Continuous Demetrios Loll, MD 75 mL/hr at 10/28/16 1453    . albuterol (PROVENTIL) (2.5 MG/3ML) 0.083% nebulizer solution 2.5 mg  2.5 mg Nebulization Q2H PRN Demetrios Loll, MD      . enoxaparin (LOVENOX) injection 40 mg  40 mg Subcutaneous  Q24H Demetrios Loll, MD   40 mg at 10/27/16 2119  . gabapentin (NEURONTIN) capsule 300 mg  300 mg Oral BID Bettey Costa, MD      . levothyroxine (SYNTHROID, LEVOTHROID) tablet 88 mcg  88 mcg Oral Daily Demetrios Loll, MD   88 mcg at 10/28/16 0535  . losartan (COZAAR) tablet 25 mg  25 mg Oral Daily Demetrios Loll, MD   25 mg at 10/28/16 1139  . metoprolol tartrate (LOPRESSOR) tablet 12.5 mg  12.5 mg Oral BID Demetrios Loll, MD   12.5 mg at 10/28/16 1139  . ondansetron (ZOFRAN) tablet 4 mg  4 mg Oral Q6H PRN Demetrios Loll, MD       Or  . ondansetron [facility]) injection 4 mg  4 mg Intravenous Q6H PRN Demetrios Loll, MD      . senna-docusate (Senokot-S) tablet 1 tablet  1 tablet Oral QHS PRN Demetrios Loll, MD        Musculoskeletal: Strength & Muscle Tone: decreased Gait & Station: unable to stand Patient leans: N/A  Psychiatric Specialty Exam: Physical Exam  Nursing note and vitals reviewed. Constitutional: She appears well-developed and well-nourished.  HENT:  Head: Normocephalic and atraumatic.  Eyes: Conjunctivae are normal. Pupils are equal, round, and reactive to light.  Neck: Normal range of motion.  Cardiovascular: Regular rhythm and normal heart sounds.   Respiratory: Effort normal.  GI: Soft.  Musculoskeletal: Normal range of motion.  Neurological: She is alert. She displays tremor.  Skin: Skin is warm and dry.  Psychiatric: Her affect is not blunt. Her speech is delayed. She is slowed. Thought content is not paranoid. Cognition and memory are impaired. She expresses no homicidal and no suicidal ideation.    Review of Systems  Constitutional: Negative.   HENT: Negative.   Eyes: Negative.  Respiratory: Negative.   Cardiovascular: Negative.   Gastrointestinal: Negative.   Musculoskeletal: Negative.   Skin: Negative.   Neurological: Positive for dizziness and tremors.  Psychiatric/Behavioral: Negative for depression, hallucinations, memory loss, substance abuse and suicidal ideas. The patient has insomnia.  The patient is not nervous/anxious.     Blood pressure (!) 153/64, pulse 67, temperature 97.6 F (36.4 C), temperature source Oral, resp. rate 18, height '5\' 5"'$  (1.651 m), weight 85.8 kg (189 lb 2 oz), SpO2 97 %.Body mass index is 31.47 kg/m.  General Appearance: Casual  Eye Contact:  Minimal  Speech:  Slow  Volume:  Decreased  Mood:  Euthymic  Affect:  Constricted  Thought Process:  Coherent  Orientation:  Full (Time, Place, and Person)  Thought Content:  Logical  Suicidal Thoughts:  No  Homicidal Thoughts:  No  Memory:  Immediate;   Good Recent;   Fair Remote;   Good  Judgement:  Fair  Insight:  Fair  Psychomotor Activity:  Decreased  Concentration:  Concentration: Fair  Recall:  [employer] of Knowledge:  Fair  Language:  Fair  Akathisia:  No  Handed:  Right  AIMS (if indicated):     Assets:  Housing Social Support  ADL's:  Impaired  Cognition:  Impaired,  Mild  Sleep:        Treatment Plan Summary: Medication management and Plan Not going to add any new psychiatric medicine. Repeated some education about bipolar disorder. No further psychiatric intervention indicated at this point.  Disposition: Patient does not meet criteria for psychiatric inpatient admission. Supportive therapy provided about ongoing stressors.  Alethia Berthold, MD 10/28/2016 8:48 PM

## 2016-10-29 DIAGNOSIS — Z8659 Personal history of other mental and behavioral disorders: Secondary | ICD-10-CM

## 2016-10-29 LAB — COMPREHENSIVE METABOLIC PANEL
ALK PHOS: 140 U/L — AB (ref 38–126)
ALT: 449 U/L — AB (ref 14–54)
ANION GAP: 6 (ref 5–15)
AST: 298 U/L — ABNORMAL HIGH (ref 15–41)
Albumin: 3.1 g/dL — ABNORMAL LOW (ref 3.5–5.0)
BUN: 12 mg/dL (ref 6–20)
CALCIUM: 9 mg/dL (ref 8.9–10.3)
CO2: 23 mmol/L (ref 22–32)
CREATININE: 0.71 mg/dL (ref 0.44–1.00)
Chloride: 115 mmol/L — ABNORMAL HIGH (ref 101–111)
Glucose, Bld: 96 mg/dL (ref 65–99)
Potassium: 3.3 mmol/L — ABNORMAL LOW (ref 3.5–5.1)
Sodium: 144 mmol/L (ref 135–145)
TOTAL PROTEIN: 6.2 g/dL — AB (ref 6.5–8.1)
Total Bilirubin: 3.2 mg/dL — ABNORMAL HIGH (ref 0.3–1.2)

## 2016-10-29 LAB — CMV DNA BY PCR, QUALITATIVE: CMV DNA, Qual PCR: NEGATIVE

## 2016-10-29 MED ORDER — TRAMADOL HCL 50 MG PO TABS
50.0000 mg | ORAL_TABLET | Freq: Once | ORAL | Status: AC
  Administered 2016-10-29: 10:00:00 50 mg via ORAL
  Filled 2016-10-29: qty 1

## 2016-10-29 NOTE — Care Management (Signed)
Late entry from 10/28/16: RNCM received notification from [facility] that per RN patient could return home with home health after "walking to bedroom door" per RN. Plan is to now go home with home health which has [facility] arranged [facility]. Bedside commode has been delivered for patient to take home. If this plan changes please contact RNCM. 10/29/16: Corene Cornea with [facility] updated that plan is for patient to return home today. Discharge order is in. No other RNCM needs.

## 2016-10-29 NOTE — Discharge Instructions (Signed)
Home Health RN and Physical Therapy

## 2016-10-29 NOTE — Care Management (Addendum)
RNCM received requested from [place] per daughter requesting [facility] to recheck labs in the home. One time order obtained from Dr. Benjie Karvonen and added to home health nurse order for LFT draw tomorrow. Home health nurse to follow up DR. Hande for further labs. This RNCM has contacted Dr. Linton Ham office notified of this potential need and for follow up with patient to monitor labs. She has an appointment in for lab tomorrow at Dr. Linton Ham office which I have cancelled. They will listen out for home health nurse/lab result. Dianna/daughter updated on plan and appointment on 5/17 with Dr. Ginette Pitman. Resources for public transportation shared for [facility] and [place] with daughter per daughter's request.

## 2016-10-29 NOTE — Progress Notes (Signed)
MD order received to discharge pt home with with home health today; Care Management previously established home health services for pt; bedside commode delivered to pt's room on 10/28/16; verbally reviewed AVS with pt and pt's daughter, no questions voiced at this time; pt discharged via wheelchair by nursing to the visitor's entrance

## 2016-10-29 NOTE — Clinical Social Work Note (Signed)
Pt is ready for discharge today and will return home with home health services. Pt's daughter is aware and agreeable to discharge plan. Pt's daughter and son in law will provide transportation after 30. Home health services have been arranged by [facility]. Pt's daughter has requested a home health RN to do the blood draws tomorrow. RNCM updated. CSW is signing off as no further needs identified.   Darden Dates, MSW, LCSW  Clinical Social [place]  [phone]

## 2016-11-10 ENCOUNTER — Other Ambulatory Visit
Admission: RE | Admit: 2016-11-10 | Discharge: 2016-11-10 | Disposition: A | Discharge status: Home / Self Care | Payer: Medicare Other | Source: Ambulatory Visit | Attending: Gastroenterology | Admitting: Gastroenterology

## 2016-11-10 ENCOUNTER — Other Ambulatory Visit: Payer: Self-pay

## 2016-11-10 ENCOUNTER — Encounter: Payer: Self-pay | Admitting: Gastroenterology

## 2016-11-10 ENCOUNTER — Ambulatory Visit (INDEPENDENT_AMBULATORY_CARE_PROVIDER_SITE_OTHER): Payer: Medicare Other | Admitting: Gastroenterology

## 2016-11-10 VITALS — BP 122/78 | HR 68 | Temp 97.4°F | Ht 65.0 in | Wt 194.8 lb

## 2016-11-10 DIAGNOSIS — K7460 Unspecified cirrhosis of liver: Secondary | ICD-10-CM | POA: Diagnosis not present

## 2016-11-10 DIAGNOSIS — K7030 Alcoholic cirrhosis of liver without ascites: Secondary | ICD-10-CM

## 2016-11-10 LAB — PROTIME-INR
INR: 1.01
Prothrombin Time: 13.3 seconds (ref 11.4–15.2)

## 2016-11-10 LAB — HEPATIC FUNCTION PANEL
ALT: 36 U/L (ref 14–54)
AST: 34 U/L (ref 15–41)
Albumin: 3.8 g/dL (ref 3.5–5.0)
Alkaline Phosphatase: 132 U/L — ABNORMAL HIGH (ref 38–126)
BILIRUBIN INDIRECT: 0.6 mg/dL (ref 0.3–0.9)
Bilirubin, Direct: 0.3 mg/dL (ref 0.1–0.5)
Total Bilirubin: 0.9 mg/dL (ref 0.3–1.2)
Total Protein: 6.8 g/dL (ref 6.5–8.1)

## 2016-11-10 NOTE — Progress Notes (Signed)
Primary Care Physician: Tracie Harrier, MD  Primary Gastroenterologist:  Dr. Jonathon Bellows   Chief Complaint  Patient presents with  . abnormal lft    HPI: Christy Mendoza is a 81 y.o. female    She is here today for a hospital follow up.  Summary of history : She was admitted on 10/26/2016 when she presented to the ER after a fall and I was consulted for incidental note of elevated transaminases. She had been on Valproate for bi polar . Her ultrasound of the liver suggested cirrhosis which was new to her . It was decided to evaluate the cirrhosis as an outpatient . Gall stones also noted on the USG liver. Valproate was held . Acute hepatitis B/C/EBV/HSV/VZV/CMV/HIV/Ceruloplasmin was normal/negative. INR 1.06 , platelets 140 , albumin 3.3    Component     Latest Ref Rng & Units 10/26/2016 10/29/2016  AST     15 - 41 U/L 1,129 (H) 298 (H)  ALT     14 - 54 U/L 752 (H) 449 (H)  Alkaline Phosphatase     38 - 126 U/L 127 (H) 140 (H)  Total Bilirubin     0.3 - 1.2 mg/dL 3.5 (H) 3.2 (H)  EGFR (Non-African Amer.)     >60 mL/min 58 (L) >60    Interval history   10/29/2016-  11/10/2016    No new issues , doing well , all her parkinsonism symptoms have resolved after Dc valproate. No new LFT's. She has a bowel movement daily which is soft .   Current Outpatient Prescriptions  Medication Sig Dispense Refill  . aspirin 81 MG chewable tablet Chew 81 mg by mouth daily.    . Cinnamon 500 MG capsule Take by mouth.    . folic acid (FOLVITE) 1 MG tablet Take 1 mg by mouth 2 (two) times daily.    [name] gabapentin (NEURONTIN) 600 MG tablet Take 1 tablet (600 mg total) by mouth at bedtime.  0  . levothyroxine (SYNTHROID, LEVOTHROID) 88 MCG tablet Take 88 mcg by mouth daily.  3  . losartan (COZAAR) 25 MG tablet Take 25 mg by mouth daily.  3  . metoprolol tartrate (LOPRESSOR) 25 MG tablet Take 12.5 mg by mouth 2 (two) times daily.  3  . Omega-3 Fatty Acids (FISH OIL PO) Take by mouth.    . vitamin  B-12 (CYANOCOBALAMIN) 500 MCG tablet Take 500 mcg by mouth 2 (two) times daily.     No current facility-administered medications for this visit.     Allergies as of 11/10/2016 - Review Complete 11/10/2016  Allergen Reaction Noted  . Hydrochlorothiazide Shortness Of Breath 10/26/2016  . Lisinopril Shortness Of Breath and Cough 04/02/2016  . Cephalexin Other (See Comments) 04/02/2016    ROS:  General: Negative for anorexia, weight loss, fever, chills, fatigue, weakness. ENT: Negative for hoarseness, difficulty swallowing , nasal congestion. CV: Negative for chest pain, angina, palpitations, dyspnea on exertion, peripheral edema.  Respiratory: Negative for dyspnea at rest, dyspnea on exertion, cough, sputum, wheezing.  GI: See history of present illness. GU:  Negative for dysuria, hematuria, urinary incontinence, urinary frequency, nocturnal urination.  Endo: Negative for unusual weight change.    Physical Examination:   BP 122/78 (BP Location: Right Arm, Patient Position: Sitting, Cuff Size: Large)   Pulse 68   Temp 97.4 F (36.3 C) (Oral)   Ht '5\' 5"'$  (1.651 m)   Wt 194 lb 12.8 oz (88.4 kg)   BMI 32.42 kg/m  General: Well-nourished, well-developed in no acute distress. Walks with a walker  Eyes: No icterus. Conjunctivae pink. Mouth: Oropharyngeal mucosa moist and pink , no lesions erythema or exudate. Lungs: Clear to auscultation bilaterally. Non-labored. Heart: Regular rate and rhythm, no murmurs rubs or gallops.  Abdomen: Bowel sounds are normal, nontender, nondistended, no hepatosplenomegaly or masses, no abdominal bruits or hernia , no rebound or guarding.   Extremities: No lower extremity edema. No clubbing or deformities. Neuro: Alert and oriented x 3.  Grossly intact. Skin: Warm and dry, no jaundice.   Psych: Alert and cooperative, normal mood and affect.  Labs:  Hepatic Function Latest Ref Rng & Units 10/29/2016 10/28/2016 10/27/2016  Total Protein 6.5 - 8.1 g/dL 6.2(L)  5.9(L) 6.2(L)  Albumin 3.5 - 5.0 g/dL 3.1(L) 3.1(L) 3.3(L)  AST 15 - 41 U/L 298(H) 422(H) 550(H)  ALT 14 - 54 U/L 449(H) 481(H) 574(H)  Alk Phosphatase 38 - 126 U/L 140(H) 123 109  Total Bilirubin 0.3 - 1.2 mg/dL 3.2(H) 4.7(H) 5.1(H)  Bilirubin, Direct 0.1 - 0.5 mg/dL - - -    Imaging Studies: Dg Chest 1 View  Result Date: 10/26/2016 CLINICAL DATA:  Syncope.  Patient status post fall.  Weakness. EXAM: CHEST 1 VIEW COMPARISON:  None. FINDINGS: Monitoring leads overlie the patient. Normal cardiac and mediastinal contours. Low lung volumes. No consolidative pulmonary opacities. No pleural effusion or pneumothorax. Surgical clips left chest wall. IMPRESSION: No acute cardiopulmonary process. Electronically Signed   By: [name] M.D.   On: 10/26/2016 08:01   Ct Head Wo Contrast  Result Date: 10/26/2016 CLINICAL DATA:  Fall, weakness, nausea/ vomiting EXAM: CT HEAD WITHOUT CONTRAST TECHNIQUE: Contiguous axial images were obtained from the base of the skull through the vertex without intravenous contrast. COMPARISON:  None. FINDINGS: Brain: No evidence of acute infarction, hemorrhage, hydrocephalus, extra-axial collection or mass lesion/mass effect. Subcortical white matter and periventricular small vessel ischemic changes. 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 Global cortical atrophy. Vascular: Intracranial atherosclerosis. Skull: Normal. Negative for fracture or focal lesion. Sinuses/Orbits: The visualized paranasal sinuses are essentially clear. The mastoid air cells are unopacified. Other: None. IMPRESSION: No evidence of acute intracranial abnormality. Atrophy with small vessel ischemic changes. 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 Electronically Signed   By: [name] M.D.   On: 10/26/2016 08:05   US Abdomen Limited Ruq  Result Date: 10/26/2016 CLINICAL DATA:  Elevated liver function studies EXAM: US ABDOMEN LIMITED - RIGHT UPPER QUADRANT  COMPARISON:  None in PACs FINDINGS: Gallbladder: The gallbladder is adequately distended. There are echogenic mobile partially shadowing structures within the gallbladder compatible with stones with the largest measuring 1.3 cm. There is mild gallbladder wall thickening to have 3.8 mm. There is no positive sonographic Murphy's sign nor is there pericholecystic fluid. Common bile duct: Diameter: 6 mm.  No intraluminal stones are observed. Liver: The hepatic echotexture is mildly increased. It was difficult to penetrate the organ with the ultrasound beam. No discrete mass or ductal dilation is observed. The limited visualization of the right kidney reveals mildly increased cortical echotexture. IMPRESSION: Gallstones with mild gallbladder wall thickening but no positive sonographic Murphy's sign to suggest acute cholecystitis. Normal appearing common bile duct. Increased hepatic echotexture consistent with known cirrhosis. Mildly increased cortical echotexture of the partially imaged right kidney may reflect medical renal disease. Electronically Signed   By: [name] M.D.   On: 10/26/2016 10:11    Assessment and Plan:   Christy Hammans  Mendoza is a 81 y.o. y/o female here to follow up for abnormal Transaminases which could have been from ischemia/ valproate which has been stopped. She also has a new diagnosis of cirrhosis of the liver . She needs to have an autoimmune work up for the liver cirrhosis, very likely it is secondary to NAFLD. No encephelopathy    Plan  1. EGD to screen for esophageal varices 2. RUQ USG Q 6 monthly to screen for [place] 3. Hep A serology and Hep B surface antibody to determine vaccination status 4. Autoimmune screen  5. LFT, INR to be rechecked   Dr Jonathon Bellows  MD Follow up in 3 months

## 2016-11-11 ENCOUNTER — Telehealth: Payer: Self-pay | Admitting: Gastroenterology

## 2016-11-11 ENCOUNTER — Other Ambulatory Visit: Payer: Self-pay

## 2016-11-11 DIAGNOSIS — K7460 Unspecified cirrhosis of liver: Secondary | ICD-10-CM

## 2016-11-11 LAB — HEPATITIS A ANTIBODY, TOTAL: Hep A Total Ab: NEGATIVE

## 2016-11-11 LAB — HEPATITIS B SURFACE ANTIBODY,QUALITATIVE: Hep B S Ab: NONREACTIVE

## 2016-11-11 NOTE — Telephone Encounter (Signed)
11/11/16 Per [employer] prior auth is NOT reqired with [facility] primary for EGD [address].

## 2016-11-12 ENCOUNTER — Encounter: Payer: Self-pay | Admitting: Gastroenterology

## 2016-11-12 DIAGNOSIS — K8020 Calculus of gallbladder without cholecystitis without obstruction: Secondary | ICD-10-CM

## 2016-11-14 LAB — IMMUNOGLOBULINS A/E/G/M, SERUM
IGE (IMMUNOGLOBULIN E), SERUM: 12 [IU]/mL (ref 0–100)
IGG (IMMUNOGLOBIN G), SERUM: 927 mg/dL (ref 700–1600)
IGM, SERUM: 39 mg/dL (ref 26–217)
IgA: 80 mg/dL (ref 64–422)

## 2016-11-15 ENCOUNTER — Encounter: Payer: Self-pay | Admitting: Gastroenterology

## 2016-11-17 ENCOUNTER — Telehealth: Payer: Self-pay

## 2016-11-17 NOTE — Telephone Encounter (Signed)
-----   Message from Jonathon Bellows, MD sent at 11/16/2016  5:45 PM EDT ----- LFT's almost normalized needs Hep A/B vaccine

## 2016-11-17 NOTE — Telephone Encounter (Signed)
Advised daughter of results per Dr. Vicente Males.       LFT's almost normalized needs Hep A/B vaccine     Daughter is to contact PCP for Hep A & B vaccines.

## 2016-11-18 ENCOUNTER — Ambulatory Visit
Admission: RE | Admit: 2016-11-18 | Discharge: 2016-11-18 | Disposition: A | Discharge status: Home / Self Care | Payer: Medicare Other | Source: Ambulatory Visit | Attending: Gastroenterology | Admitting: Gastroenterology

## 2016-11-18 DIAGNOSIS — K7460 Unspecified cirrhosis of liver: Secondary | ICD-10-CM | POA: Diagnosis not present

## 2016-11-18 DIAGNOSIS — K8020 Calculus of gallbladder without cholecystitis without obstruction: Secondary | ICD-10-CM | POA: Diagnosis not present

## 2016-11-18 DIAGNOSIS — N289 Disorder of kidney and ureter, unspecified: Secondary | ICD-10-CM | POA: Diagnosis not present

## 2016-11-24 ENCOUNTER — Encounter: Payer: Self-pay | Admitting: Gastroenterology

## 2016-11-25 NOTE — Telephone Encounter (Signed)
-----   Message from Jonathon Bellows, MD sent at 11/23/2016  6:58 PM EDT ----- No new changes stable since last usg

## 2016-12-02 ENCOUNTER — Telehealth: Payer: Self-pay | Admitting: Gastroenterology

## 2016-12-02 NOTE — Telephone Encounter (Signed)
Patient is canceling her EGD and has made a follow up appt to ask questions with Dr. Vicente Males in July. I called [facility] to cancel

## 2016-12-11 ENCOUNTER — Ambulatory Visit: Admit: 2016-12-11 | Payer: Medicare Other | Admitting: Gastroenterology

## 2016-12-11 SURGERY — ESOPHAGOGASTRODUODENOSCOPY (EGD) WITH PROPOFOL
Anesthesia: General

## 2017-01-01 DIAGNOSIS — R002 Palpitations: Secondary | ICD-10-CM | POA: Insufficient documentation

## 2017-01-01 DIAGNOSIS — R0789 Other chest pain: Secondary | ICD-10-CM | POA: Insufficient documentation

## 2017-01-03 ENCOUNTER — Encounter: Payer: Self-pay | Admitting: Gastroenterology

## 2017-01-07 ENCOUNTER — Ambulatory Visit: Payer: Medicare Other | Admitting: Gastroenterology

## 2017-01-29 ENCOUNTER — Telehealth: Payer: Self-pay

## 2017-01-29 NOTE — Telephone Encounter (Signed)
-----   Message from Leontine Locket, [place] sent at 01/04/2017 10:41 AM EDT ----- Regarding: Well Check Dr. Vicente Males replied to patient's message via msg.   Requesting well check in a few weeks.

## 2017-01-29 NOTE — Telephone Encounter (Signed)
Called patient for a well-check per Dr. Vicente Males.   LVM for patient callback.

## 2017-06-23 ENCOUNTER — Other Ambulatory Visit: Payer: Self-pay | Admitting: Internal Medicine

## 2017-06-23 DIAGNOSIS — Z1231 Encounter for screening mammogram for malignant neoplasm of breast: Secondary | ICD-10-CM

## 2017-08-30 ENCOUNTER — Ambulatory Visit
Admission: RE | Admit: 2017-08-30 | Discharge: 2017-08-30 | Disposition: A | Discharge status: Home / Self Care | Payer: Medicare Other | Source: Ambulatory Visit | Attending: Internal Medicine | Admitting: Internal Medicine

## 2017-08-30 DIAGNOSIS — Z1231 Encounter for screening mammogram for malignant neoplasm of breast: Secondary | ICD-10-CM | POA: Diagnosis present

## 2018-02-21 ENCOUNTER — Ambulatory Visit: Admit: 2018-02-21 | Payer: Medicare Other | Admitting: Ophthalmology

## 2018-02-21 SURGERY — PHACOEMULSIFICATION, CATARACT, WITH IOL INSERTION
Anesthesia: Topical | Laterality: Left

## 2018-08-29 ENCOUNTER — Other Ambulatory Visit: Payer: Self-pay | Admitting: Internal Medicine

## 2018-08-29 DIAGNOSIS — Z1231 Encounter for screening mammogram for malignant neoplasm of breast: Secondary | ICD-10-CM

## 2018-09-05 ENCOUNTER — Ambulatory Visit
Admission: RE | Admit: 2018-09-05 | Discharge: 2018-09-05 | Disposition: A | Discharge status: Home / Self Care | Payer: Medicare Other | Source: Ambulatory Visit | Attending: Internal Medicine | Admitting: Internal Medicine

## 2018-09-05 DIAGNOSIS — Z1231 Encounter for screening mammogram for malignant neoplasm of breast: Secondary | ICD-10-CM | POA: Insufficient documentation

## 2019-01-02 ENCOUNTER — Other Ambulatory Visit: Payer: Self-pay | Admitting: Internal Medicine

## 2019-01-02 ENCOUNTER — Other Ambulatory Visit (HOSPITAL_COMMUNITY): Payer: Self-pay | Admitting: Internal Medicine

## 2019-01-02 DIAGNOSIS — G609 Hereditary and idiopathic neuropathy, unspecified: Secondary | ICD-10-CM

## 2019-01-02 DIAGNOSIS — R29898 Other symptoms and signs involving the musculoskeletal system: Secondary | ICD-10-CM

## 2019-01-02 DIAGNOSIS — R413 Other amnesia: Secondary | ICD-10-CM

## 2019-01-06 ENCOUNTER — Other Ambulatory Visit: Payer: Self-pay

## 2019-01-06 ENCOUNTER — Ambulatory Visit
Admission: RE | Admit: 2019-01-06 | Discharge: 2019-01-06 | Disposition: A | Discharge status: Home / Self Care | Payer: Medicare Other | Source: Ambulatory Visit | Attending: Internal Medicine | Admitting: Internal Medicine

## 2019-01-06 DIAGNOSIS — R413 Other amnesia: Secondary | ICD-10-CM | POA: Diagnosis present

## 2019-01-06 DIAGNOSIS — G609 Hereditary and idiopathic neuropathy, unspecified: Secondary | ICD-10-CM | POA: Insufficient documentation

## 2019-01-06 DIAGNOSIS — R29898 Other symptoms and signs involving the musculoskeletal system: Secondary | ICD-10-CM | POA: Insufficient documentation

## 2019-09-27 ENCOUNTER — Encounter: Payer: Self-pay | Admitting: Psychology

## 2019-11-13 DIAGNOSIS — F411 Generalized anxiety disorder: Secondary | ICD-10-CM

## 2019-11-13 HISTORY — DX: Generalized anxiety disorder: F41.1

## 2019-11-21 ENCOUNTER — Ambulatory Visit: Payer: Medicare Other | Admitting: Psychology

## 2019-11-21 ENCOUNTER — Ambulatory Visit (INDEPENDENT_AMBULATORY_CARE_PROVIDER_SITE_OTHER): Payer: Medicare Other | Admitting: Psychology

## 2019-11-21 ENCOUNTER — Encounter: Payer: Self-pay | Admitting: Psychology

## 2019-11-21 ENCOUNTER — Other Ambulatory Visit: Payer: Self-pay

## 2019-11-21 DIAGNOSIS — I639 Cerebral infarction, unspecified: Secondary | ICD-10-CM | POA: Diagnosis not present

## 2019-11-21 DIAGNOSIS — I4891 Unspecified atrial fibrillation: Secondary | ICD-10-CM | POA: Insufficient documentation

## 2019-11-21 DIAGNOSIS — F3170 Bipolar disorder, currently in remission, most recent episode unspecified: Secondary | ICD-10-CM | POA: Diagnosis not present

## 2019-11-21 DIAGNOSIS — R4189 Other symptoms and signs involving cognitive functions and awareness: Secondary | ICD-10-CM

## 2019-11-21 DIAGNOSIS — G3184 Mild cognitive impairment, so stated: Secondary | ICD-10-CM

## 2019-11-21 HISTORY — DX: Mild cognitive impairment of uncertain or unknown etiology: G31.84

## 2019-11-21 NOTE — Progress Notes (Signed)
   Psychometrician Note   Cognitive testing was administered to Christy Mendoza by Milana Kidney, B.S. (psychometrist) under the supervision of Dr. Christia Reading, Ph.D., licensed psychologist on 11/21/19. Ms. Besecker did not appear overtly distressed by the testing session per behavioral observation or responses across self-report questionnaires. Dr. Christia Reading, Ph.D. checked in with Christy Mendoza as needed to manage any distress related to testing procedures (if applicable). Rest breaks were offered.    The battery of tests administered was selected by Dr. Christia Reading, Ph.D. with consideration to Christy Mendoza current level of functioning, the nature of her symptoms, emotional and behavioral responses during interview, level of literacy, observed level of motivation/effort, and the nature of the referral question. This battery was communicated to the psychometrist. Communication between Dr. Christia Reading, Ph.D. and the psychometrist was ongoing throughout the evaluation and Dr. Christia Reading, Ph.D. was immediately accessible at all times. Dr. Christia Reading, Ph.D. provided supervision to the psychometrist on the date of this service to the extent necessary to assure the quality of all services provided.    Christy Mendoza will return within approximately 1-2 weeks for an interactive feedback session with Dr. Melvyn Novas at which time her test performances, clinical impressions, and treatment recommendations will be reviewed in detail. Christy Mendoza understands she can contact our office should she require our assistance before this time.  A total of 130 minutes of billable time were spent face-to-face with Christy Mendoza by the psychometrist. This includes both test administration and scoring time. Billing for these services is reflected in the clinical report generated by Dr. Christia Reading, Ph.D..  This note reflects time spent with the psychometrician and does not include test scores or any clinical interpretations  made by Dr. Melvyn Novas. The full report will follow in a separate note.

## 2019-11-21 NOTE — Progress Notes (Addendum)
NEUROPSYCHOLOGICAL EVALUATION [facility] [place]. [facility] Department of Neurology  Date of Evaluation: Nov 21, 2019  Reason for Referral:   Tasiana Haske is a 84 y.o. right-handed Caucasian female referred by Tracie Harrier, M.D., to characterize her current cognitive functioning and assist with diagnostic clarity and treatment planning in the context of subjective cognitive decline, several cardiovascular comorbidities, and a history of bipolar disorder.   Assessment and Plan:   Clinical Impression(s): Ms. Bowhay pattern of performance is suggestive of primary difficulties with retrieval aspects of visual information (i.e., delayed recall of a previously learned complex figure) and response inhibition. Isolated weaknesses were also exhibited across a timed decoding task, as well as a line orientation task. However, as performance across other processing speed and visuospatial/constructional tasks was quite strong, these isolated deficits may be due to macular degeneration and her history of other vision-related difficulties. Overall, performance was largely within appropriate normative ranges across processing speed, attention/concentration, cognitive flexibility, safety and judgment, receptive and expressive language, visuoconstructional abilities, and verbal learning and memory. Ms. Garry largely denied difficulties completing instrumental activities of daily living (ADLs) independently. As such, given evidence for cognitive dysfunction described above, she meets criteria for a Mild Neurocognitive Disorder (formerly "mild cognitive impairment"). However, the very mild nature of this diagnosis should be emphasized at the present time.   The etiology for cognitive weaknesses is likely multifactorial in nature. Deficits in response inhibition (i.e., the ability to stop a largely automatic response and shift to the desired response) can reflect impulsivity and be related to Ms. Somogyi  history of bipolar disorder. In addition, bipolar disorder can also create subtle to mild inefficiencies across processing speed and attention/concentration, especially impacting day-to-day functioning. It is possible that the primary cause for observed cognitive weaknesses is her history of bipolar disorder, as well as other mood-related factors (including generalized anxiety). While bipolar disorder may represent the primary culprit, Ms. Sybesma does have a positive stroke history and individuals with an underlying vascular etiology for cognitive concerns commonly have a similar pattern of weaknesses. Ms. Hashemi processing speed and other aspects of executive functioning (cognitive flexibility, safety and judgment) were quite strong, which represents the main reason for this etiology currently being designated as secondary. It should also be said that performances in the below average normative range may represent a subtle decline from a previously higher level of functioning, which could further support a vascular etiology. However, there is no prior testing for comparison purposes and these scores may reflect normal intraindividual variability across a battery of tests or longstanding patterns of strengths and weaknesses.   Specific to memory, Ms. Berezin was able to learn novel verbal information efficiently. While she had some trouble recalling a previously learned complex figure, her later recall and retention of verbal information was excellent. Overall, memory performance combined with intact performances across other areas of cognitive functioning is not suggestive of Alzheimer's disease. Likewise, her cognitive and behavioral profile is not suggestive of any other form of neurodegenerative illness presently.  Recommendations: A repeat neuropsychological evaluation in 18 months (or sooner if functional decline is noted) is recommended to assess the trajectory of future cognitive decline should it occur.  This will also aid in future efforts towards improved diagnostic clarity.  Ms. Kopinski daughter expressed a desire for a referral for her mother to engage in psychotherapy as a means to discuss ongoing stressors, as well as a way to develop strategies for improving mood swings or outbursts. Ms. Labelle appeared somewhat  willing to engage in this form of treatment and I do feel that this would be beneficial for her. While mood-related questionnaires were unable to be completed, it could also help address ongoing anxiety. She would benefit from an active and collaborative therapeutic environment, rather than one purely supportive in nature. Recommended treatment modalities include Cognitive Behavioral Therapy (CBT) or Acceptance and Commitment Therapy (ACT). I will place a referral with Ashley in [place].   Ms. Boak is encouraged to attend to lifestyle factors for brain health (e.g., regular physical exercise, good nutrition habits, regular participation in cognitively-stimulating activities, and general stress management techniques), which are likely to have benefits for both emotional adjustment and cognition. In fact, in addition to promoting good general health, regular exercise incorporating aerobic activities (e.g., brisk walking, jogging, cycling, etc.) has been demonstrated to be a very effective treatment for depression and stress, with similar efficacy rates to both antidepressant medication and psychotherapy. Optimal control of vascular risk factors (including safe cardiovascular exercise and adherence to dietary recommendations) is encouraged.  If interested, there are some activities which have therapeutic value and can be useful in keeping her cognitively stimulated. For suggestions, Ms. Marzilli is encouraged to go to the following website: https://www.[facility].org/get-to-know-barrow/centers-programs/neurorehabilitation-center/neuro-rehab-apps-and-games/ which has options,  categorized by level of difficulty. It should be noted that these activities should not be viewed as a substitute for therapy.  For day-to-day problems recalling information, she may benefit from using strategies to aid with her learning and memory, such as asking questions for clarification, requesting that information to be repeated, or repeating an explanation in her own words to ensure comprehension and promote encoding.    Memory can be improved using internal strategies such as rehearsal, repetition, chunking, mnemonics, association, and imagery. External strategies such as written notes in a consistently used memory journal, visual and nonverbal auditory cues such as a calendar on the refrigerator or appointments with alarm, such as on a cell phone, can also help maximize recall.    To address problems with fluctuating attention, she may wish to consider:   -Avoiding external distractions when needing to concentrate   -Limiting exposure to fast paced environments with multiple sensory demands   -Writing down complicated information and using checklists   -Attempting and completing one task at a time (i.e., no multi-tasking)   -Verbalizing aloud each step of a task to maintain focus   -Reducing the amount of information considered at one time  Reducing anxiety may also aid in the retrieval of information. Ms. Kalu is encouraged to prepare scripts she can use socially when she experiences difficulty with word finding or memory. Such scripts should be brief explanations of the difficulty (e.g., "the word escapes me now") and allow her to move the conversation forward quickly rather than dwelling on the issue.  Review of Records:   Ms. Shean was seen by [facility] Tracie Harrier, M.D.) most recently on 11/13/2019 for her annual wellness exam. During this appointment, she reported a recent episode of having a "funny sensation" in her chest. She also acknowledged ongoing symptoms of anxiety and  depression during recent days, especially when she is unable to sleep well. Her gait was described as unsteady; however, she utilizes external support and denied recent falls. She was noted to independently manage personal grooming, household chores, and medication management. Regarding cognitive dysfunction, records suggest that Ms. Coch denied episodes of misplacing objects or increased forgetfulness. Despite this, concerns were expressed surrounding a mild neurocognitive disorder. Ultimately, Ms. Hodgson  was referred for a comprehensive neuropsychological evaluation to characterize her cognitive abilities and to assist with diagnostic clarity and treatment planning.   Head CT on 10/26/2016 revealed atrophy with small vessel ischemic changes, as well as "old" right basal ganglia and left thalamic lacunar infarcts. Head CT on 01/06/2019 revealed moderate parenchymal volume loss and mild white matter ischemic changes, while also noting "chronic" infarcts as described above. Observed progression of white matter changes was not reported.   Past Medical History:  Diagnosis Date  . Adenoma 2016   Bilateral adrenal glands  . Aortic atherosclerosis 2016  . Bipolar I disorder   . Cholelithiasis 2016  . Drug-induced cirrhosis 2018   Said to be related to prescription drug use (valproic acid); all Parkinson-like symptoms were said to have dissipated  . GAD (generalized anxiety disorder) 11/21/2019   Has been referred for psychotherapy  . History of breast cancer 2013   Lumpectomy with lymph nodes removed (left)  . Laryngopharyngeal reflux (LPR)    Only symptom is dry cough; said to have resolved following diet changes and spending less time in her recliner chair  . Mild neurocognitive disorder 11/21/2019  . Osteoarthritis   . Paroxysmal atrial fibrillation 2016  . Peripheral neuropathy    Symptoms affect feet; said to be caused by an antibiotic administered to treat post-op C.Diff.  . Stroke    Brain MRI  - lacunar infarcts in right basal ganglia and left thalamus    Past Surgical History:  Procedure Laterality Date  . APPENDECTOMY    . BREAST LUMPECTOMY Left 2012   pt unsure of lumpectomy date  . HERNIA REPAIR  2010  . PARATHYROIDECTOMY  1996    Current Outpatient Medications:   aspirin 81 MG chewable tablet, Chew 81 mg by mouth daily  gabapentin (NEURONTIN) 1200 MG tablet, Take 2 tablets by mouth at bedtime  levothyroxine (SYNTHROID, LEVOTHROID) 88 MCG tablet, Take 88 mcg by mouth daily  losartan (COZAAR) 25 MG tablet, Take 25 mg by mouth daily  metoprolol tartrate (LOPRESSOR) 25 MG tablet, Take 12.5 mg by mouth 2 (two) times daily  vitamin 0000000 + Folic Acid supplement, Take 1 under the tongue three days a week  vitamin D supplement 2,000 IU (50 mcg), Take 1 per day  Clinical Interview:   Cognitive Symptoms: Decreased short-term memory: Endorsed. Ms. Kargbo acknowledged some instances of forgetting the details of previous conversations. However, memory concerns appeared mild per her report. Her daughter expressed more significant concerns surrounding memory. She described more generalized deficits with short-term memory, noting that these have been present for the past year. Her daughter also expressed concern that Ms. Granato may be minimizing anxiety she has about cognitive decline.  Decreased long-term memory: Denied. Decreased attention/concentration: Denied. Reduced processing speed: Denied. Difficulties with executive functions: Denied. Overt personality changes were likewise denied.  Difficulties with emotion regulation: Endorsed. Her daughter described a noticeable increase in mood swings exhibited by her mother, largely attributed to her history of bipolar disorder. Ms. Denoncourt generally laughed off these comments but did not contradict their occurrence.  Difficulties with receptive language: Denied. However, her daughter reported concerns surrounding her mother's ability to  comprehend what others are speaking to her and a perceived tendency to place the blame on the poor articulation of others. Ms. Wertheim disagreed with this characterization and requested that her daughter provide her with an example; however, no examples were provided. Difficulties with word finding: Denied. However, her daughter reported the presence of word finding difficulties,  noting an increase in frequency.  Decreased visuoperceptual ability: Denied.  Difficulties completing ADLs: Largely denied. Her daughter does provide assistance with medication management. However, both she and Ms. Berth agreed that Ms. Reinholtz could perform this task adequately independently and that Ms. Carnahan has even caught an error or two made by her daughter. She stopped driving back in [id number] due to motor-related symptoms stemming from side effects of valproic acid. While these motor symptoms have subsided, she has continued to not drive.   Additional Medical History: History of traumatic brain injury/concussion: Denied. History of stroke: Endorsed. Prior medical records suggest remote lacunar infarcts in the right basal ganglia and left thalamus. These events were largely described as silent. Additionally, Ms. Kanda daughter described a remote instance of a likely transient ischemic attack (TIA) where Ms. Hedge experienced significant word finding difficulties and stuttering behaviors for approximately 30-40 minutes. Symptoms eventually subsided without any known residual deficits.  History of seizure activity: Denied. History of known exposure to toxins: Denied. Symptoms of chronic pain: Endorsed. She described symptoms of osteoarthritis to be very mild in nature.  Experience of frequent headaches/migraines: Denied. Frequent instances of dizziness/vertigo: Denied.  Sensory changes: She reported being nearsighted and wearing glasses with positive effect. She also reported macular degeneration in her left eye, as well as a  history of cataracts. Approximately six months prior, she described the presence of "wavy lines" in her visual field. A potential cause for these distortions was unknown. Other sensory changes/difficulties (e.g., hearing, taste, or smell) were denied.  Balance/coordination difficulties: Endorsed. However, balance concerns were somewhat minimized and Ms. Khamvongsa reported generally feeling steady on her feet. She described a history of motor dysfunction due to side effects of taking valproic acid for an extended period of time. However, these motor symptoms largely dissipated following the discontinuation of this medication. She ambulates with the assistance of a rolling walker. She acknowledged a history of several falls, but denied any since 2018 following medication discontinuation.  Other motor difficulties: Denied outside what is described above. Current upper extremity motor symptoms were denied.  Sleep History: Estimated hours obtained each night: 4-6 hours per night. She also reported occasionally taking an hour long nap during the day.  Difficulties falling asleep: Endorsed. More specifically, she reported it commonly taking 30-45 minutes for her to fall asleep after lying down.  Difficulties staying asleep: Largely denied. She reported waking throughout the night to use the restroom, but denied significant difficulties falling back asleep.  Feels rested and refreshed upon awakening: Endorsed following nights where she is able to obtain six hours of sleep. However, her daughter reported that Ms. Bullard will commonly report feeling tired or fatigued throughout the day.   History of snoring: Denied. History of waking up gasping for air: Denied. Witnessed breath cessation while asleep: Denied.  History of vivid dreaming: Denied. Excessive movement while asleep: Denied. Instances of acting out her dreams: Denied.  Psychiatric/Behavioral Health History: Depression: Endorsed. Ms. Kihara was previously  diagnosed with bipolar disorder. She had been prescribed valproic acid for an extended period of time to treat these symptoms. However, following the emergence of motor-based side effects, she discontinued this medication in 2018. Since this time, her bipolar disorder has been largely untreated from a medication standpoint. However, she reported doing reasonably well and denied any recent manic episodes. Her most prominent mood symptom included frequent mood swings, likely related to her bipolar disorder. Regarding her current mood, Ms. Borin reported experiencing only "4 sad days during the  past month." Current or remote suicidal ideation, intent, or plan was denied.  Anxiety: Endorsed. Anxiety symptoms were said to have been increasing lately. She is currently prescribed gabapentin which seems helpful for mood stabilization. She denied current psychotherapeutic treatment for mood concerns.  Mania: Endorsed (see above).  Trauma History: Denied. Visual/auditory hallucinations: Unclear. Ms. Seacat reported an instance where she was experiencing color-based hallucinations involving inanimate objects in her environment. This seemed to occur following her receiving the first dose of her COVID-19 vaccine; however, a correlation between symptom onset and this vaccine is unknown. Symptoms were described as very short-lived and have not occurred since.  Delusional thoughts: Denied.  Tobacco: Denied. Alcohol: She denied current alcohol consumption, as well as a history of problematic alcohol abuse or dependence.  Recreational drugs: Denied. Caffeine: 1 cup of tea in the morning.   Family History: Problem Relation Age of Onset  . Alcoholism Father   . Breast cancer Neg Hx    This information was confirmed by Ms. Gwenlyn Saran.  Academic/Vocational History: Highest level of educational attainment: 12 years. Ms. Ruddy graduated from high school. 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 She reported diminished grades during her junior and senior years, but still maintained a B average. GPA decline was partially attributed to her engaging in other school-related extracurricular activities during her upper-classman years. Word-based math problems represented a relative weakness. Her daughter also reported a longstanding weakness in visuospatial tasks relative to verbal abilities. History of developmental delay: Denied. History of grade repetition: Denied. Enrollment in special education courses: Denied. History of LD/ADHD: Denied.  Employment: Retired. She previously worked in Retail buyer, as well as a Charity fundraiser.   Evaluation Results:   Behavioral Observations: Ms. Cosco was accompanied by her daughter, arrived to her appointment on time, and was appropriately dressed and groomed. She appeared alert and oriented. She ambulated with the assistance of a rolling walker. She operated this device appropriately, did not bump into any objects, and ambulated at an appropriate pace. Gross motor functioning appeared intact upon informal observation and no abnormal movements (e.g., tremors) were noted. Her affect was generally relaxed and positive. She laughed very frequently, including after being asked the majority of questions during interview. This also persisted during testing where she laughed after the majority of task instructions were explained to her. Spontaneous speech was fluent and word finding difficulties were not observed during the clinical interview. Thought processes were coherent, organized, and normal in content. Insight into her cognitive difficulties appeared largely appropriate. During testing, sustained attention was appropriate. She requested immediate feedback throughout a majority of testing (i.e., asking if her responses were correct or what the correct answer was). This continued even after it was explained by the psychometrist that  immediate feedback was not provided in these settings and that tests still needed to be scored. Task engagement was adequate and she persisted when challenged initially. She did fatigue as the evaluation progressed, leading to it being slightly abbreviated. She was also unable to complete mood-related questionnaires due to fatigue levels. Overall, Ms. Nuber was cooperative with the clinical interview and subsequent testing procedures.   Adequacy of Effort: The validity of neuropsychological testing is limited by the extent to which the individual being tested may be assumed to have exerted adequate effort during testing. Ms. Lamay expressed her intention to perform to the best of her abilities and exhibited adequate task engagement and persistence. Scores across stand-alone and embedded  performance validity measures were within expectation. As such, the results of the current evaluation are believed to be a valid representation of Ms. Hinkelman current cognitive functioning.  Test Results: Ms. Sanpedro was mildly disoriented at the time of the current evaluation. She was unable to state her phone number. Points were also lost for her being unable to state the current city [facility]") or her location.  Intellectual abilities based upon educational and vocational attainment were estimated to be in the average range. Premorbid abilities were estimated to be within the above average range based upon a single-word reading test.   Processing speed was variable, but performance was largely within average to above average normative ranges. An isolated rapid decoding task scored in the well below average range, potentially impacted by her history of macular degeneration and other vision-related concerns. Basic attention was average. More complex attention (e.g., working memory) was below average to average. Executive functioning was average to above average across cognitive flexibility and judgment tasks. Performance was  variable across response inhibition.  Assessed receptive language abilities were above average. Sentence repetition was below average, while performance on a semantic knowledge screening test was within expectation. Assessed expressive language (e.g., verbal fluency and confrontation naming) was average.     Assessed visuospatial/visuoconstructional abilities were variable. An isolated weakness across a line orientation task may have been influenced by macular degeneration and other vision-related difficulties. Performance across visuoconstructional abilities was strong.    Learning (i.e., encoding) of novel verbal information was average. Spontaneous delayed recall (i.e., retrieval) of previously learned information was variable. Verbal recall was average to above average, while her recall of a complex figure was exceptionally low. Retention rates were 67% across a story learning task, 75% across a list learning task, and 5% across a complex figure drawing task. Performance across recognition tasks was strong across verbal measures, but below expectation across a complex figure task, suggesting some evidence for information consolidation.   A screening instrument assessing recent sleep quality suggested the presence of minimal bordering mild sleep dysfunction. Mood-related questionnaires were not completed due to fatigue.  Tables of Scores:   Note: This summary of test scores accompanies the interpretive report and should not be considered in isolation without reference to the appropriate sections in the text. Descriptors are based on appropriate normative data and may be adjusted based on clinical judgment. The terms "impaired" and "within normal limits (WNL)" are used when a more specific level of functioning cannot be determined.       Effort Testing:   DESCRIPTOR       Dot Counting Test: --- --- Within Expectation  RBANS Effort Index: --- --- Within Expectation  WAIS-IV Reliable Digit Span:  --- --- Within Expectation  D-KEFS Color Word Effort Index: --- --- Within Expectation       Orientation:      Raw Score Percentile   NAB Orientation, Form 1 24/29 --- ---       Cognitive Screening:          RBANS, Form A: Standard Score/ Scaled Score Percentile   Total Score 95 37 Average  Immediate Memory 100 50 Average    List Learning 9 37 Average    Story Memory 11 63 Average  Visuospatial/Constructional 96 39 Average    Figure Copy 14 91 Above Average    Line Orientation 11/20 3-9 Well Below Average  Language 92 30 Average    Picture Naming 10/10 >75 Above Average    Semantic Fluency 6 9  Below Average  Attention 103 58 Average    Digit Span 16 98 Exceptionally High    Coding 5 5 Well Below Average  Delayed Memory 93 32 Average    List Recall 6/10 >75 Above Average    List Recognition 20/20 51-75 Average    Story Recall 8 25 Average    Story Recognition 11/12 68-86 Average    Figure Recall 2 <1 Exceptionally Low    Figure Recognition 3/8 6-20 Below Average       Intellectual Functioning:           Standard Score Percentile   Test of Premorbid Functioning: 114 82 Above Average       Attention/Executive Function:          Trail Making Test (TMT): Raw Score (Scaled Score) Percentile     Part A 39 secs.,  0 errors (12) 75 Above Average    Part B 104 secs.,  1 error (11) 63 Average  *Based on Mayo's Older Normative Studies (MOANS)           Scaled Score Percentile   WAIS-IV Digit Span: 9 37 Average    Forward 11 63 Average    Backward 10 50 Average    Sequencing 7 16 Below Average       D-KEFS Color-Word Interference Test: Raw Score (Scaled Score) Percentile     Color Naming 34 secs. (11) 63 Average    Word Reading 21 secs. (13) 84 Above Average    Inhibition 101 secs. (9) 37 Average      Total Errors 8 errors (6) 9 Below Average    Inhibition/Switching DC'D @ 180 --- Impaired      Total Errors 6 errors (8) 25 Average       NAB Executive Functions Module,  Form 1: T Score Percentile     Judgment 61 86 Above Average       Language:           Raw Score Percentile   PPVT Screening Instrument: 14/16 --- Within Expectation  Sentence Repetition: 13/22 11 Below Average       Verbal Fluency Test: Raw Score (Scaled Score) Percentile     Phonemic Fluency (CFL) 33 (10) 50 Average    Category Fluency 33 (9) 37 Average  *Based on Mayo's Older Normative Studies (MOANS)          NAB Language Module, Form 1: T Score Percentile     Auditory Comprehension 59 82 Above Average    Naming 28/31 (48) 42 Average       Visuospatial/Visuoconstruction:      Raw Score Percentile   Clock Drawing: 9/10 --- Within Normal Limits       Mood and Personality:      Raw Score Percentile   Geriatric Depression Scale: Discontinued --- ---  Geriatric Anxiety Scale: Discontinued --- ---       Additional Questionnaires:      Raw Score Percentile   PROMIS Sleep Disturbance Questionnaire: 24 --- None to Slight   Informed Consent and Coding/Compliance:   Ms. Coonts was provided with a verbal description of the nature and purpose of the present neuropsychological evaluation. Also reviewed were the foreseeable risks and/or discomforts and benefits of the procedure, limits of confidentiality, and mandatory reporting requirements of this provider. The patient was given the opportunity to ask questions and receive answers about the evaluation. Oral consent to participate was provided by the patient.   This evaluation was conducted by Alroy Dust  Oralia Manis, Ph.D., licensed clinical neuropsychologist. Ms. Aybar completed a comprehensive clinical interview with Dr. Melvyn Novas, billed as one unit [phone], and 130 minutes of cognitive testing and scoring, billed as one unit [phone] and three additional units [id number]. Psychometrist Milana Kidney, B.S., assisted Dr. Melvyn Novas with test administration and scoring procedures. As a separate and discrete service, Dr. Melvyn Novas spent a total of 180 minutes in  interpretation and report writing billed as one unit [phone] and two units 96133.

## 2019-11-22 ENCOUNTER — Other Ambulatory Visit: Payer: Self-pay | Admitting: Internal Medicine

## 2019-11-22 ENCOUNTER — Encounter: Payer: Self-pay | Admitting: Psychology

## 2019-11-22 DIAGNOSIS — I639 Cerebral infarction, unspecified: Secondary | ICD-10-CM | POA: Insufficient documentation

## 2019-11-22 DIAGNOSIS — Z1231 Encounter for screening mammogram for malignant neoplasm of breast: Secondary | ICD-10-CM

## 2019-11-28 ENCOUNTER — Ambulatory Visit (INDEPENDENT_AMBULATORY_CARE_PROVIDER_SITE_OTHER): Payer: Medicare Other | Admitting: Psychology

## 2019-11-28 ENCOUNTER — Other Ambulatory Visit: Payer: Self-pay

## 2019-11-28 ENCOUNTER — Encounter: Payer: Self-pay | Admitting: Psychology

## 2019-11-28 DIAGNOSIS — F411 Generalized anxiety disorder: Secondary | ICD-10-CM | POA: Diagnosis not present

## 2019-11-28 DIAGNOSIS — F3170 Bipolar disorder, currently in remission, most recent episode unspecified: Secondary | ICD-10-CM

## 2019-11-28 DIAGNOSIS — I639 Cerebral infarction, unspecified: Secondary | ICD-10-CM

## 2019-11-28 NOTE — Progress Notes (Addendum)
   Neuropsychology Feedback Session Christy Mendoza. [facility] Neu[facility]  Reason for Referral:   Christy Mendoza a 84 y.o. right-handed Caucasian female referred by Christy Mendoza, M.D.,to characterize hercurrent cognitive functioning and assist with diagnostic clarity and treatment planning in the context of subjective cognitive decline, several cardiovascular comorbidities, and a history of bipolar disorder.   Feedback:   Christy Mendoza completed a comprehensive neuropsychological evaluation on 11/21/2019. Please refer to that encounter for the full report and recommendations. Briefly, results suggested primary difficulties with retrieval aspects of visual information (i.e., delayed recall of a previously learned complex figure) and response inhibition. Isolated weaknesses were also exhibited across a timed decoding task, as well as a line orientation task. However, as performance across other processing speed and visuospatial/constructional tasks was quite strong. The etiology for cognitive weaknesses is likely multifactorial in nature. Deficits in response inhibition (i.e., the ability to stop a largely automatic response and shift to the desired response) can reflect impulsivity and be related to Christy Mendoza history of bipolar disorder. In addition, bipolar disorder can also create subtle to mild inefficiencies across processing speed and attention/concentration, especially impacting day-to-day functioning. It is possible that the primary cause for observed cognitive weaknesses is her history of bipolar disorder, as well as other mood-related factors (including generalized anxiety). While bipolar disorder may represent the primary culprit, Christy Mendoza does have a positive stroke history and individuals with an underlying vascular etiology for cognitive concerns commonly have a similar pattern of weaknesses.  Christy Mendoza was accompanied by her daughter on the current telephone call.  They were at Christy Mendoza residence while I was in my office. Content of the current session focused on the results of her neuropsychological evaluation. Christy Mendoza and her daughter were given the opportunity to ask questions and their questions were answered. They will be providing some changes to her past medical history which I will adjust within an amended report upon their provision. They were encouraged to reach out should additional questions arise. Her report is available to her on MyChart.     53 minutes were spent conducting the current feedback session with Christy Mendoza, billed as one unit [phone].

## 2019-11-28 NOTE — Patient Instructions (Signed)
A repeat neuropsychological evaluation in 18 months (or sooner if functional decline is noted) is recommended to assess the trajectory of future cognitive decline should it occur. This will also aid in future efforts towards improved diagnostic clarity.  Christy Mendoza daughter expressed a desire for a referral for her mother to engage in psychotherapy as a means to discuss ongoing stressors, as well as a way to develop strategies for improving mood swings or outbursts. Christy Mendoza appeared somewhat willing to engage in this form of treatment and I do feel that this would be beneficial for her. While mood-related questionnaires were unable to be completed, it could also help address ongoing anxiety. She would benefit from an active and collaborative therapeutic environment, rather than one purely supportive in nature. Recommended treatment modalities include Cognitive Behavioral Therapy (CBT) or Acceptance and Commitment Therapy (ACT). I will place a referral with [facility] in [place].   Christy Mendoza is encouraged to attend to lifestyle factors for brain health (e.g., regular physical exercise, good nutrition habits, regular participation in cognitively-stimulating activities, and general stress management techniques), which are likely to have benefits for both emotional adjustment and cognition. In fact, in addition to promoting good general health, regular exercise incorporating aerobic activities (e.g., brisk walking, jogging, cycling, etc.) has been demonstrated to be a very effective treatment for depression and stress, with similar efficacy rates to both antidepressant medication and psychotherapy. Optimal control of vascular risk factors (including safe cardiovascular exercise and adherence to dietary recommendations) is encouraged.  If interested, there are some activities which have therapeutic value and can be useful in keeping her cognitively stimulated. For suggestions, Christy Mendoza is  encouraged to go to the following website: https://www.[facility].org/get-to-know-barrow/centers-programs/neurorehabilitation-center/neuro-rehab-apps-and-games/ which has options, categorized by level of difficulty. It should be noted that these activities should not be viewed as a substitute for therapy.  For day-to-day problems recalling information, she may benefit from using strategies to aid with her learning and memory, such as asking questions for clarification, requesting that information to be repeated, or repeating an explanation in her own words to ensure comprehension and promote encoding.    Memory can be improved using internal strategies such as rehearsal, repetition, chunking, mnemonics, association, and imagery. External strategies such as written notes in a consistently used memory journal, visual and nonverbal auditory cues such as a calendar on the refrigerator or appointments with alarm, such as on a cell phone, can also help maximize recall.    To address problems with fluctuating attention, she may wish to consider:   -Avoiding external distractions when needing to concentrate   -Limiting exposure to fast paced environments with multiple sensory demands   -Writing down complicated information and using checklists   -Attempting and completing one task at a time (i.e., no multi-tasking)   -Verbalizing aloud each step of a task to maintain focus   -Reducing the amount of information considered at one time  Reducing anxiety may also aid in the retrieval of information. Christy Mendoza is encouraged to prepare scripts she can use socially when she experiences difficulty with word finding or memory. Such scripts should be brief explanations of the difficulty (e.g., "the word escapes me now") and allow her to move the conversation forward quickly rather than dwelling on the issue.

## 2019-11-29 ENCOUNTER — Encounter: Payer: Self-pay | Admitting: Psychology

## 2019-11-29 ENCOUNTER — Ambulatory Visit
Admission: RE | Admit: 2019-11-29 | Discharge: 2019-11-29 | Disposition: A | Discharge status: Home / Self Care | Payer: Medicare Other | Source: Ambulatory Visit | Attending: Internal Medicine | Admitting: Internal Medicine

## 2019-11-29 DIAGNOSIS — Z1231 Encounter for screening mammogram for malignant neoplasm of breast: Secondary | ICD-10-CM | POA: Insufficient documentation

## 2019-12-26 ENCOUNTER — Ambulatory Visit: Payer: Medicare Other | Admitting: Psychology

## 2020-01-19 ENCOUNTER — Ambulatory Visit: Payer: Medicare Other | Admitting: Psychology

## 2020-05-17 ENCOUNTER — Other Ambulatory Visit (HOSPITAL_COMMUNITY): Payer: Self-pay | Admitting: Internal Medicine

## 2020-05-17 ENCOUNTER — Other Ambulatory Visit: Payer: Self-pay | Admitting: Internal Medicine

## 2020-05-17 DIAGNOSIS — R911 Solitary pulmonary nodule: Secondary | ICD-10-CM

## 2020-05-29 ENCOUNTER — Ambulatory Visit
Admission: RE | Admit: 2020-05-29 | Discharge: 2020-05-29 | Disposition: A | Discharge status: Home / Self Care | Payer: Medicare Other | Source: Ambulatory Visit | Attending: Internal Medicine | Admitting: Internal Medicine

## 2020-05-29 ENCOUNTER — Other Ambulatory Visit: Payer: Self-pay

## 2020-05-29 DIAGNOSIS — R911 Solitary pulmonary nodule: Secondary | ICD-10-CM | POA: Diagnosis present

## 2020-07-09 ENCOUNTER — Ambulatory Visit: Payer: Medicare Other | Attending: Internal Medicine

## 2020-07-09 ENCOUNTER — Other Ambulatory Visit: Payer: Self-pay | Admitting: Internal Medicine

## 2020-07-09 DIAGNOSIS — Z23 Encounter for immunization: Secondary | ICD-10-CM

## 2020-07-09 NOTE — Progress Notes (Signed)
   Covid-19 Vaccination Clinic  Name:  Ladoris Lythgoe    MRN: [id number] DOB: 1936/03/18  07/09/2020  Ms. Plasencia was observed post Covid-19 immunization for 15 minutes without incident. She was provided with Vaccine Information Sheet and instruction to access the V-Safe system.   Ms. Brucks was instructed to call 911 with any severe reactions post vaccine: [name] Difficulty breathing  . Swelling of face and throat  . A fast heartbeat  . A bad rash all over body  . Dizziness and weakness   Immunizations Administered    Name Date Dose VIS Date Route   Moderna Covid-19 Booster Vaccine 07/09/2020  1:47 PM 0.25 mL 04/17/2020 Intramuscular   Manufacturer: Moderna   Lot: [id number]   [place]: [id number]

## 2020-10-17 ENCOUNTER — Other Ambulatory Visit: Payer: Self-pay | Admitting: Internal Medicine

## 2020-10-17 DIAGNOSIS — Z1231 Encounter for screening mammogram for malignant neoplasm of breast: Secondary | ICD-10-CM

## 2020-11-29 ENCOUNTER — Other Ambulatory Visit: Payer: Self-pay

## 2020-11-29 ENCOUNTER — Ambulatory Visit
Admission: RE | Admit: 2020-11-29 | Discharge: 2020-11-29 | Disposition: A | Discharge status: Home / Self Care | Payer: Medicare Other | Source: Ambulatory Visit | Attending: Internal Medicine | Admitting: Internal Medicine

## 2020-11-29 DIAGNOSIS — Z1231 Encounter for screening mammogram for malignant neoplasm of breast: Secondary | ICD-10-CM | POA: Diagnosis present

## 2020-11-29 HISTORY — DX: Malignant neoplasm of unspecified site of unspecified female breast: C50.919

## 2021-05-26 ENCOUNTER — Other Ambulatory Visit: Payer: Self-pay

## 2021-05-26 ENCOUNTER — Emergency Department
Admission: EM | Admit: 2021-05-26 | Discharge: 2021-05-26 | Disposition: A | Discharge status: Home / Self Care | Payer: Medicare Other | Attending: Emergency Medicine | Admitting: Emergency Medicine

## 2021-05-26 ENCOUNTER — Emergency Department: Payer: Medicare Other

## 2021-05-26 DIAGNOSIS — R42 Dizziness and giddiness: Secondary | ICD-10-CM | POA: Diagnosis present

## 2021-05-26 DIAGNOSIS — E039 Hypothyroidism, unspecified: Secondary | ICD-10-CM | POA: Diagnosis not present

## 2021-05-26 DIAGNOSIS — Z7982 Long term (current) use of aspirin: Secondary | ICD-10-CM | POA: Insufficient documentation

## 2021-05-26 DIAGNOSIS — I10 Essential (primary) hypertension: Secondary | ICD-10-CM | POA: Insufficient documentation

## 2021-05-26 DIAGNOSIS — Z853 Personal history of malignant neoplasm of breast: Secondary | ICD-10-CM | POA: Diagnosis not present

## 2021-05-26 DIAGNOSIS — Z79899 Other long term (current) drug therapy: Secondary | ICD-10-CM | POA: Insufficient documentation

## 2021-05-26 LAB — URINALYSIS, ROUTINE W REFLEX MICROSCOPIC
Bilirubin Urine: NEGATIVE
Glucose, UA: NEGATIVE mg/dL
Hgb urine dipstick: NEGATIVE
Ketones, ur: NEGATIVE mg/dL
Nitrite: NEGATIVE
Protein, ur: NEGATIVE mg/dL
Specific Gravity, Urine: 1.01 (ref 1.005–1.030)
pH: 6 (ref 5.0–8.0)

## 2021-05-26 LAB — BASIC METABOLIC PANEL
Anion gap: 4 — ABNORMAL LOW (ref 5–15)
BUN: 17 mg/dL (ref 8–23)
CO2: 26 mmol/L (ref 22–32)
Calcium: 9.3 mg/dL (ref 8.9–10.3)
Chloride: 109 mmol/L (ref 98–111)
Creatinine, Ser: 0.95 mg/dL (ref 0.44–1.00)
GFR, Estimated: 59 mL/min — ABNORMAL LOW (ref >60)
Glucose, Bld: 117 mg/dL — ABNORMAL HIGH (ref 70–99)
Potassium: 3.6 mmol/L (ref 3.5–5.1)
Sodium: 139 mmol/L (ref 135–145)

## 2021-05-26 LAB — CBC
HCT: 42.6 % (ref 36.0–46.0)
Hemoglobin: 14.1 g/dL (ref 12.0–15.0)
MCH: 31.1 pg (ref 26.0–34.0)
MCHC: 33.1 g/dL (ref 30.0–36.0)
MCV: 93.8 fL (ref 80.0–100.0)
Platelets: 188 10*3/uL (ref 150–400)
RBC: 4.54 MIL/uL (ref 3.87–5.11)
RDW: 13.1 % (ref 11.5–15.5)
WBC: 7.3 10*3/uL (ref 4.0–10.5)
nRBC: 0 % (ref 0.0–0.2)

## 2021-05-26 LAB — TROPONIN I (HIGH SENSITIVITY)
Troponin I (High Sensitivity): 8 ng/L (ref <18)
Troponin I (High Sensitivity): 5 ng/L (ref <18)

## 2021-05-26 NOTE — ED Notes (Signed)
Pt brought in by [facility] from home for dizziness and hyperglycemia.

## 2021-05-26 NOTE — ED Provider Notes (Signed)
[facility]  ____________________________________________   Event Date/Time   First MD Initiated Contact with Patient 05/26/21 0800     (approximate)  I have reviewed the triage vital signs and the nursing notes.   HISTORY  Chief Complaint Dizziness    HPI Christy Mendoza is a 85 y.o. female with past medical history of paroxysmal A. fib, prior CVA, bipolar disorder, mild cognitive disorder who presents with an episode of dizziness.  Symptoms started this morning.  Patient was sitting down making her breakfast and when she stood up she felt acutely dizzy.  She has trouble describing exactly what she felt but denies lightheadedness or presyncope and denies clear room spinning sensation.  Does feel unsteady.  She had difficulty ambulating at this time.  She denies associated diplopia dysarthria focal numbness weakness or headache.  Symptoms lasted until about 1 and half hours ago while she was in the emergency department.  She denies associated chest pain, shortness of breath nausea or diaphoresis.  No recent illnesses no fever or dysuria.  Patient tells me she has had similar episodes like this in the past but were not as severe.  She currently is asymptomatic.         Past Medical History:  Diagnosis Date   Adenoma 2016   Bilateral adrenal glands   Aortic atherosclerosis 2016   Bipolar I disorder    Breast cancer ([place])    Cholelithiasis 2016   Drug-induced cirrhosis 2018   Said to be related to prescription drug use (valproic acid); all Parkinson-like symptoms were said to have dissipated   GAD (generalized anxiety disorder) 11/21/2019   Has been referred for psychotherapy   History of breast cancer 2013   Lumpectomy with lymph nodes removed (left)   Laryngopharyngeal reflux (LPR)    Only symptom is dry cough; said to have resolved following diet changes and spending less time in her recliner chair   Mild neurocognitive disorder 11/21/2019    Osteoarthritis    Paroxysmal atrial fibrillation 2016   Peripheral neuropathy    Symptoms affect feet; said to be caused by an antibiotic administered to treat post-op C.Diff.   Stroke    Brain MRI - lacunar infarcts in right basal ganglia and left thalamus    Patient Active Problem List   Diagnosis Date Noted   Stroke    Atrial fibrillation    GAD (generalized anxiety disorder) 11/13/2019   Atypical chest pain 01/01/2017   Heart palpitations 01/01/2017   Calculus of gallbladder without cholecystitis without obstruction    Bipolar disorder ([place]) 10/27/2016   Insomnia 10/27/2016   Abnormal LFTs 10/26/2016   Acquired hypothyroidism 04/02/2016   Adrenal adenoma 04/02/2016   Aortic atherosclerosis ([place]) 04/02/2016   Benign essential hypertension 04/02/2016   Generalized osteoarthritis of multiple sites 04/02/2016   Idiopathic peripheral neuropathy 04/02/2016   Leg weakness, bilateral 04/02/2016   PAF (paroxysmal atrial fibrillation) ([place]) 04/02/2016   Urge urinary incontinence 04/02/2016    Past Surgical History:  Procedure Laterality Date   APPENDECTOMY     BREAST LUMPECTOMY Left 2012   pt unsure of lumpectomy date   HERNIA REPAIR  2010   [place]    Prior to Admission medications   Medication Sig Start Date End Date Taking? Authorizing Provider  aspirin 81 MG chewable tablet       Cinnamon 500 MG capsule Take by mouth.    [provider]  COVID-19 mRNA vaccine, Moderna, 100 MCG/0.5ML injection USE AS DIRECTED  07/09/20 07/09/21  Carlyle Basques, MD  folic acid (FOLVITE) 1 MG tablet Take 1 mg by mouth 2 (two) times daily.    [provider]  gabapentin (NEURONTIN) 600 MG tablet Take 1 tablet (600 mg total) by mouth at bedtime. 10/28/16   Bettey Costa, MD  levothyroxine (SYNTHROID, LEVOTHROID) 88 MCG tablet Take 88 mcg by mouth daily. 09/30/16   [provider]  losartan (COZAAR) 25 MG tablet Take 25 mg by mouth daily. 10/11/16   [provider]  metoprolol tartrate (LOPRESSOR) 25 MG tablet Take 12.5 mg by mouth 2 (two) times daily. 10/12/16   [provider]  Omega-3 Fatty Acids (FISH OIL PO) Take by mouth.    [provider]  vitamin B-12 (CYANOCOBALAMIN) 500 MCG tablet Take 500 mcg by mouth 2 (two) times daily.    [provider]    Allergies Hydrochlorothiazide, Lisinopril, and Cephalexin  Family History  Problem Relation Age of Onset   Alcoholism Father    Breast cancer Neg Hx     Social History Social History   Tobacco Use   Smoking status: Never   Smokeless tobacco: Never  Vaping Use   Vaping Use: Never used  Substance Use Topics   Alcohol use: No   Drug use: No    Review of Systems   Review of Systems  Constitutional:  Negative for appetite change, chills and fever.  Respiratory:  Negative for shortness of breath.   Cardiovascular:  Negative for chest pain.  Gastrointestinal:  Negative for abdominal pain, nausea and vomiting.  Genitourinary:  Negative for dysuria.  Neurological:  Positive for dizziness. Negative for weakness and numbness.  All other systems reviewed and are negative.  Physical Exam Updated Vital Signs BP (!) 159/94 (BP Location: Right Arm)   Pulse 69   Temp 98.7 F (37.1 C) (Oral)   Resp 20   SpO2 98%   Physical Exam Vitals and nursing note reviewed.  Constitutional:      General: She is not in acute distress.    Appearance: Normal appearance.  HENT:     Head: Normocephalic and atraumatic.  Eyes:     General: No scleral icterus.    Conjunctiva/sclera: Conjunctivae normal.  Pulmonary:     Effort: Pulmonary effort is normal. No respiratory distress.     Breath sounds: No stridor.  Musculoskeletal:        General: No deformity or signs of injury.     Cervical back: Normal range of motion.  Skin:    General: Skin is dry.     Coloration: Skin is not jaundiced or pale.  Neurological:     General: No focal deficit present.      Mental Status: She is alert and oriented to person, place, and time. Mental status is at baseline.     Comments: Aox3, nml speech  PERRL, EOMI, face symmetric, nml tongue movement  5/5 strength in the BL upper and lower extremities  Sensation grossly intact in the BL upper and lower extremities  Finger-nose-finger intact BL   Psychiatric:        Mood and Affect: Mood normal.        Behavior: Behavior normal.     LABS (all labs ordered are listed, but only abnormal results are displayed)  Labs Reviewed  BASIC METABOLIC PANEL - Abnormal; Notable for the following components:      Result Value   Glucose, Bld 117 (*)    GFR, Estimated 59 (*)  Anion gap 4 (*)    All other components within normal limits  URINALYSIS, ROUTINE W REFLEX MICROSCOPIC - Abnormal; Notable for the following components:   Leukocytes,Ua SMALL (*)    Bacteria, UA RARE (*)    All other components within normal limits  CBC  TROPONIN I (HIGH SENSITIVITY)  TROPONIN I (HIGH SENSITIVITY)   ____________________________________________  EKG  NSR, nml axis, nml intervals, no acute ischemic changes  ____________________________________________  RADIOLOGY Almeta Monas, personally viewed and evaluated these images (plain radiographs) as part of my medical decision making, as well as reviewing the written report by the radiologist.  ED MD interpretation:  I reviewed the CT scan of the brain which does not show any acute intracranial process      ____________________________________________   PROCEDURES  Procedure(s) performed (including Critical Care):  Procedures   ____________________________________________   INITIAL IMPRESSION / ASSESSMENT AND PLAN / ED COURSE     Patient is an 85 year old female who presents after a self-limited episode of dizziness.  She has trouble describing exactly what it felt like but really denies presyncope and vertiginous symptoms more unsteadiness.   Apparently she had difficulty walking when EMS arrived.  Vital signs within normal limits.  At the time of my evaluation patient is now completely asymptomatic and feels back to baseline.  During the episode she denies any associated dysarthria or dysmetria diplopia numbness weakness.  On my neurologic exam she has no abnormal findings including normal cerebellar testing.  Her CT head here does not show any acute findings.  Labs are also reassuring including negative troponin and normal EKG.  She does have some leuks in her urine but she is asymptomatic without dysuria or fever so will not treat.  Differential includes posterior TIA versus peripheral versus orthostasis.  Peripheral TIA certainly possible given the symptoms and timeframe however with her not having any other associated neurologic symptoms and being back to baseline with a normal neurologic exam do not feel that she needs further work-up at this time as this is less likely.  We did discuss that if she has recurrent symptoms that are not improving that she should return to the emergency department as this could be a presentation of a posterior stroke.  She is stable for discharge at this time.      ____________________________________________   FINAL CLINICAL IMPRESSION(S) / ED DIAGNOSES  Final diagnoses:  Dizziness     ED Discharge Orders     None        Note:  This document was prepared using Dragon voice recognition software and may include unintentional dictation errors.    Rada Hay, MD 05/26/21 [phone]

## 2021-05-26 NOTE — ED Triage Notes (Signed)
Pt BIB EMS due to having dizziness. Per pt, she has had some dizziness, but it has gotten progressively worse throughout the night. Per pt, the dizziness gets worse when she stands. Pt a&ox4.

## 2021-05-26 NOTE — Discharge Instructions (Signed)
Your blood work, EKG and CAT scan of your brain were all reassuring.  It is possible that your dizziness episode was related to an inner ear problem.  However if you have recurrent symptoms that are not improving, please return to the emergency department as this can be a presentation of a stroke.

## 2021-10-20 ENCOUNTER — Other Ambulatory Visit: Payer: Self-pay | Admitting: Internal Medicine

## 2021-10-20 DIAGNOSIS — Z1231 Encounter for screening mammogram for malignant neoplasm of breast: Secondary | ICD-10-CM

## 2021-12-26 ENCOUNTER — Ambulatory Visit
Admission: RE | Admit: 2021-12-26 | Discharge: 2021-12-26 | Disposition: A | Discharge status: Home / Self Care | Payer: Medicare Other | Source: Ambulatory Visit | Attending: Internal Medicine | Admitting: Internal Medicine

## 2021-12-26 DIAGNOSIS — Z1231 Encounter for screening mammogram for malignant neoplasm of breast: Secondary | ICD-10-CM | POA: Diagnosis present

## 2022-11-16 ENCOUNTER — Other Ambulatory Visit: Payer: Self-pay | Admitting: Internal Medicine

## 2022-11-16 DIAGNOSIS — Z1231 Encounter for screening mammogram for malignant neoplasm of breast: Secondary | ICD-10-CM

## 2022-11-19 IMAGING — CT CT CHEST W/O CM
2 of 4 series · 15 of 36 positions shown, 18 images · non-contrast
Comparison: None available

CLINICAL DATA: Lung nodule, hypertension, hyperlipidemia, atypical
chest pain, remote left breast cancer

EXAM:
CT CHEST WITHOUT CONTRAST
TECHNIQUE: Multidetector CT imaging of the chest was performed following the
standard protocol without IV contrast.

[Series 2: chest 2.00 · axial · 0.68mm/px · z∈[-1209,-929]mm · 12 of 166 slices shown, 15 images]
[im 13/166  mediastinal]
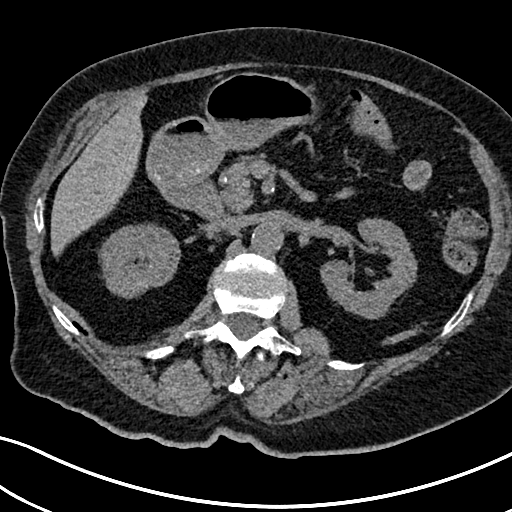
[im 13/166  lung]
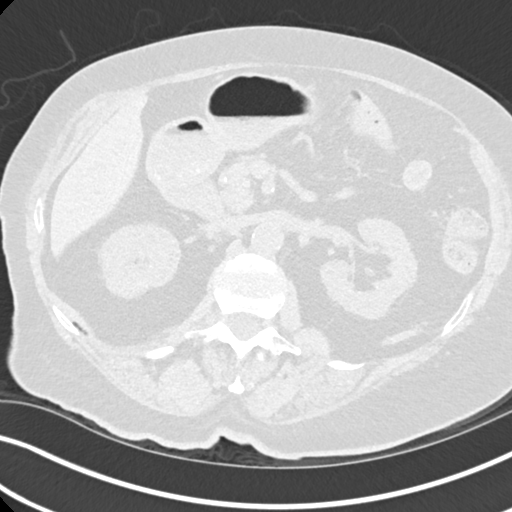
[im 26/166  lung]
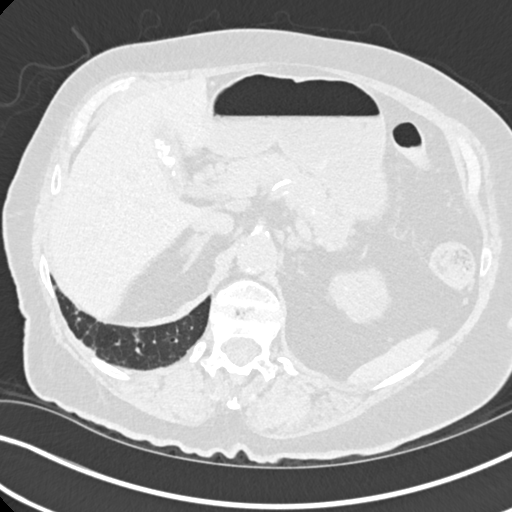
[im 39/166  lung]
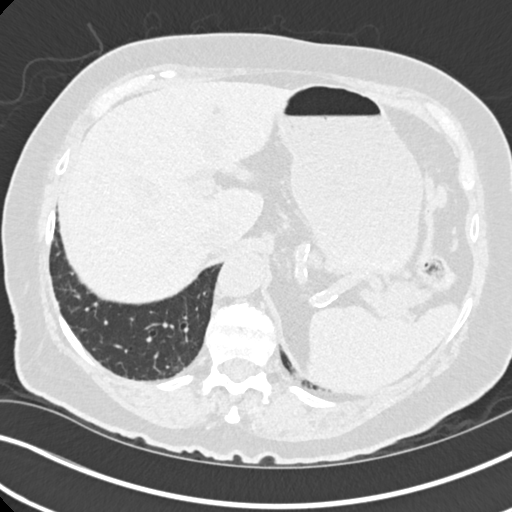
[im 51/166  lung]
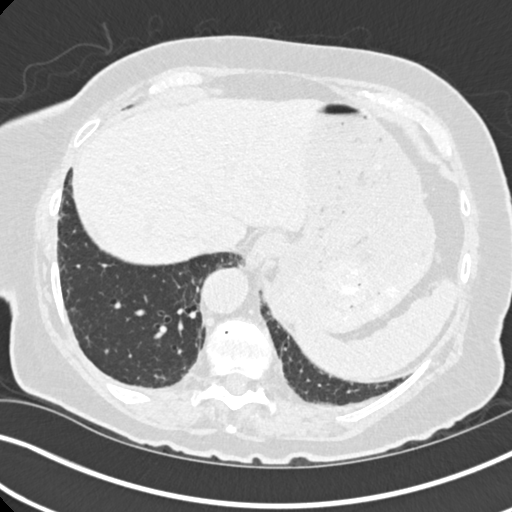
[im 64/166  mediastinal]
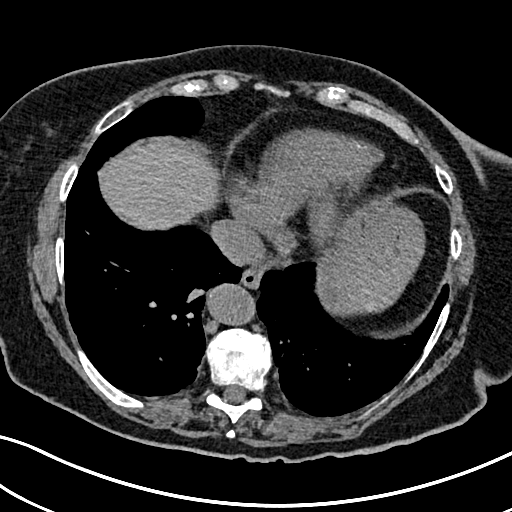
[im 64/166  lung]
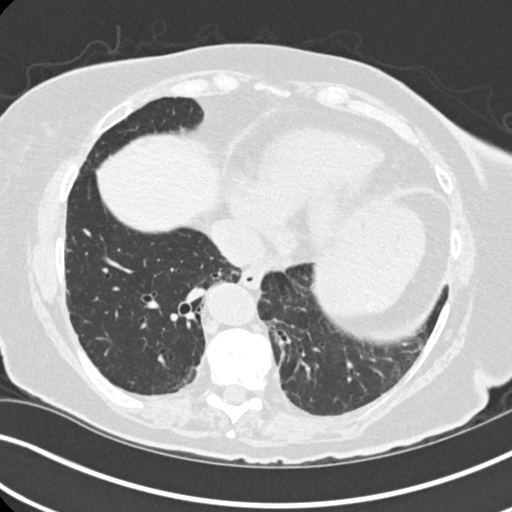
[im 77/166  lung]
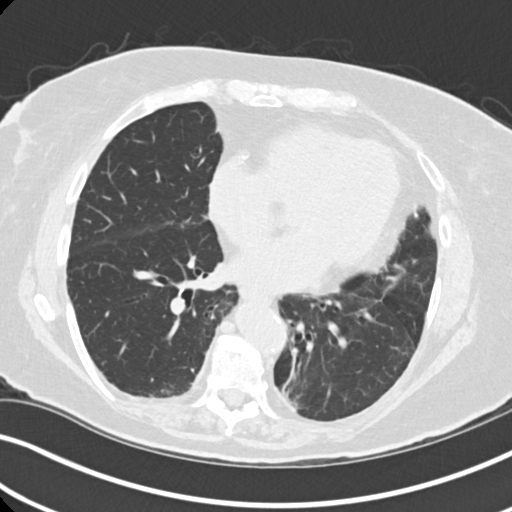
[im 89/166  lung]
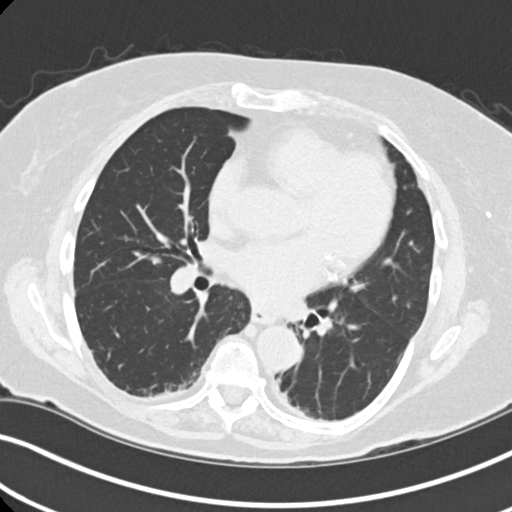
[im 102/166  lung]
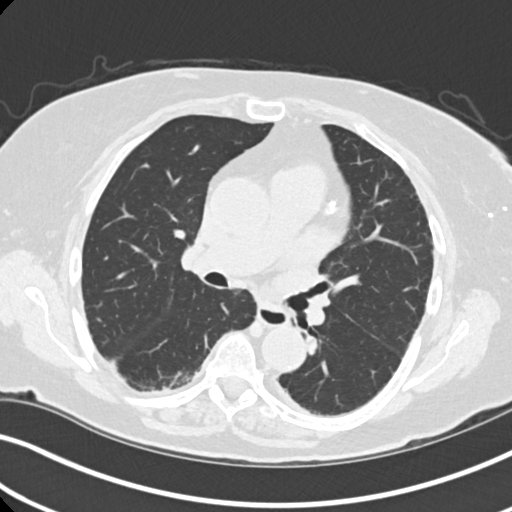
[im 115/166  mediastinal]
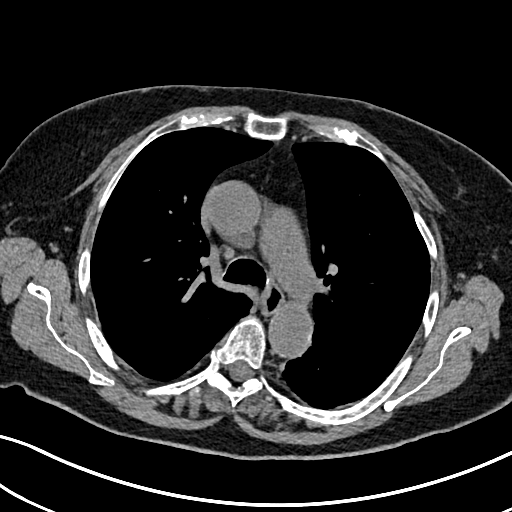
[im 115/166  lung]
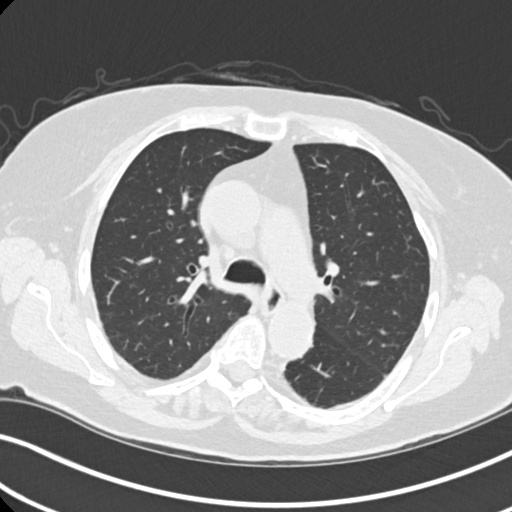
[im 127/166  lung]
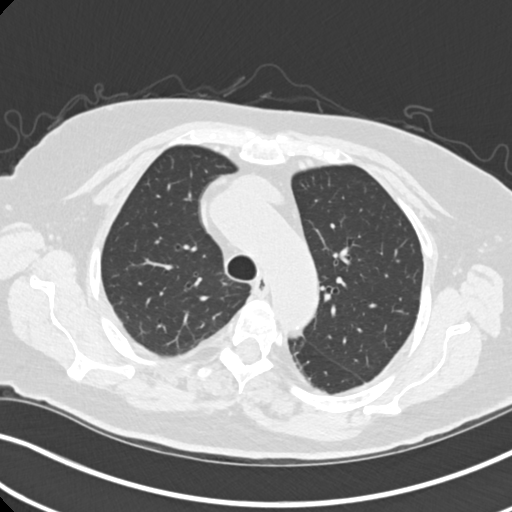
[im 140/166  lung]
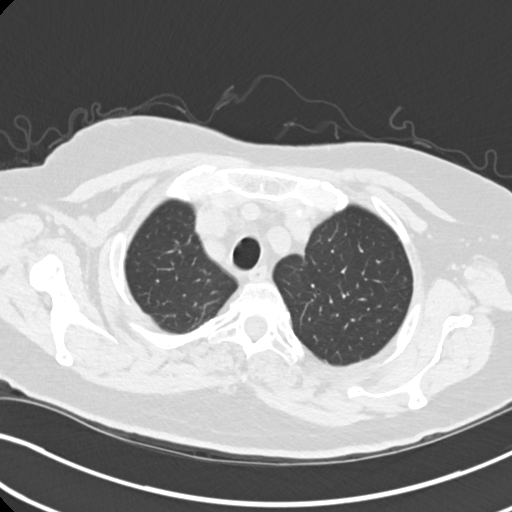
[im 153/166  lung]
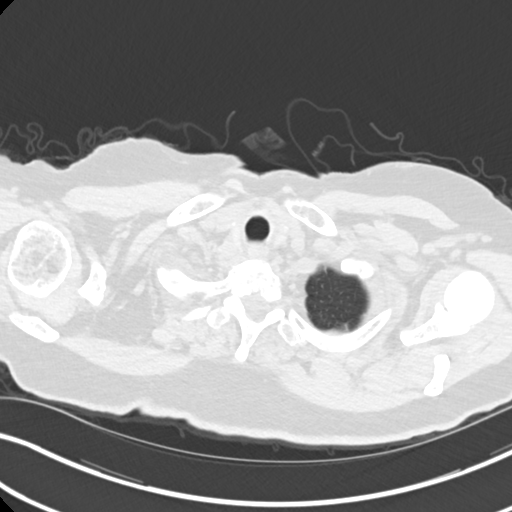

[Series 5: coronals chest 2.00 cor · coronal · 0.65mm/px · 3 of 157 slices shown]
[im 32/157  lung]
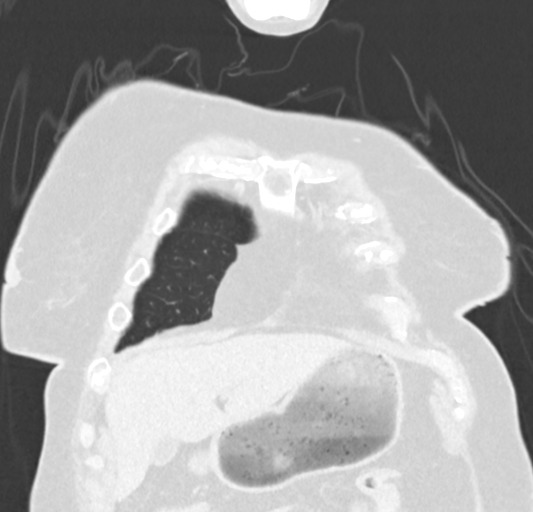
[im 63/157  lung]
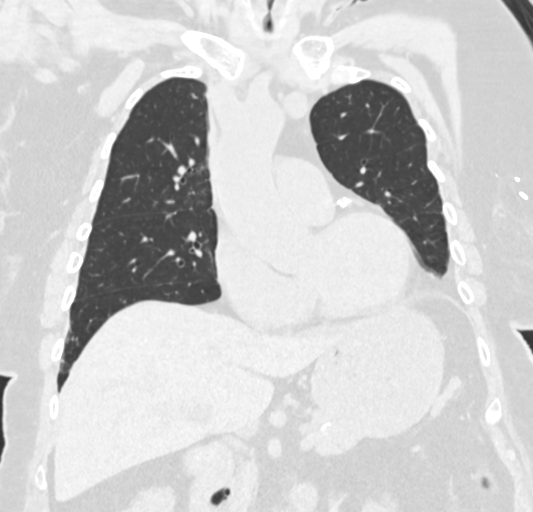
[im 94/157  lung]
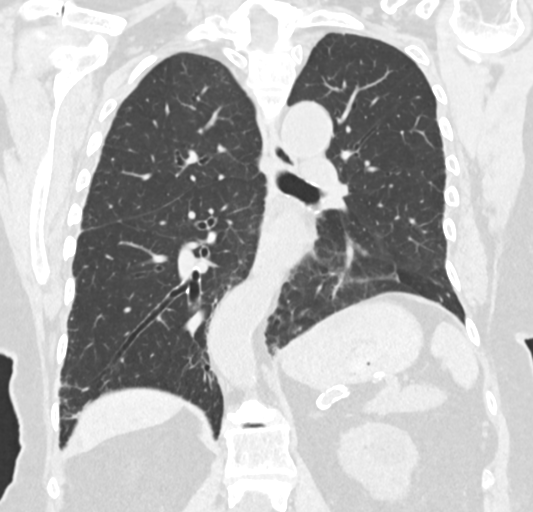

[15 of 36 positions shown; findings below may reference images not displayed]

FINDINGS: Cardiovascular: Limited without IV contrast. Atherosclerotic
calcifications of the aorta. Three vessel arch anatomy. Arch vessels
are tortuous. No mediastinal hemorrhage or hematoma. Native coronary
atherosclerosis. Normal heart size. Trace fluid in the superior
pericardial recess noted. No large pericardial effusion.

Mediastinum/Nodes: Atrophic thyroid with subcentimeter
calcifications on the right. Trachea central airways appear patent.
Air noted in the esophagus without significant distension. No hiatal
hernia.

No bulky adenopathy within the limits of noncontrast imaging.

Lungs/Pleura: No suspicious pulmonary nodule or mass. Scattered
areas of peripheral/subpleural scarring most pronounced in the lower
lobes. Minor dependent basilar atelectasis/scarring. Very minimal
bibasilar cylindrical bronchiectasis without wall thickening, airway
obstruction or mucous plugging.

Fissural thickening noted along the right major fissure posteriorly.

Central airways appear patent.

No acute airspace process, collapse or consolidation. No
interstitial process or edema.

No pleural abnormality, effusion or pneumothorax.

Upper Abdomen: Small calcified gallstones. Gallbladder remains
collapsed. No surrounding inflammatory process. Abdominal
atherosclerosis noted. Suspect 2.6 cm left kidney upper pole
hypodense cyst.

Musculoskeletal: Degenerative changes and mild scoliosis of the
spine. No acute osseous finding or severe compression fracture.
Intact sternum.
IMPRESSION: Negative for pulmonary nodule or mass.

Mid and lower lung subpleural scattered areas of
scarring/atelectasis.

Nonspecific fissural thickening along the right major fissure.

Minor bibasilar cylindrical bronchiectasis without wall thickening
or airway obstruction. No mucous plugging.

No acute intrathoracic finding by noncontrast imaging.

Cholelithiasis

Probable left upper pole renal cyst

Aortic Atherosclerosis ([id number]).

## 2022-12-28 ENCOUNTER — Ambulatory Visit
Admission: RE | Admit: 2022-12-28 | Discharge: 2022-12-28 | Disposition: A | Discharge status: Home / Self Care | Payer: Medicare Other | Source: Ambulatory Visit | Attending: Internal Medicine | Admitting: Internal Medicine

## 2022-12-28 DIAGNOSIS — Z1231 Encounter for screening mammogram for malignant neoplasm of breast: Secondary | ICD-10-CM | POA: Diagnosis present

## 2023-04-20 ENCOUNTER — Other Ambulatory Visit (HOSPITAL_COMMUNITY): Payer: Self-pay

## 2023-11-15 ENCOUNTER — Other Ambulatory Visit: Payer: Self-pay | Admitting: Internal Medicine

## 2023-11-15 DIAGNOSIS — Z1231 Encounter for screening mammogram for malignant neoplasm of breast: Secondary | ICD-10-CM

## 2024-02-22 ENCOUNTER — Ambulatory Visit (INDEPENDENT_AMBULATORY_CARE_PROVIDER_SITE_OTHER)

## 2024-02-22 DIAGNOSIS — L304 Erythema intertrigo: Secondary | ICD-10-CM

## 2024-02-22 DIAGNOSIS — L8990 Pressure ulcer of unspecified site, unspecified stage: Secondary | ICD-10-CM

## 2024-02-22 MED ORDER — NYSTATIN 100000 UNIT/GM EX POWD: CUTANEOUS | 4 refills | Status: AC

## 2024-02-22 NOTE — Progress Notes (Signed)
   New Patient Visit   Subjective  Christy Mendoza is a 88 y.o. female who presents for the following: Rash  On bilateral groin and bottom of abdomen flared x1 month. Has used Vaseline and neosporin as well as a diaper rash cream.   The following portions of the chart were reviewed this encounter and updated as appropriate: medications, allergies, medical history  Review of Systems:  No other skin or systemic complaints except as noted in HPI or Assessment and Plan.  Objective  Well appearing patient in no apparent distress; mood and affect are within normal limits.  A focused examination was performed of the following areas: Abdomen, Groin, buttock  Infrapannus, groin with erythematous patches Buttocks with erythematous patch   Relevant exam findings are noted in the Assessment and Plan.    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 Condition is symptomatic and bothersome to patient. Patient is flaring and not currently at treatment goal.  - Diagnosis, treatment options, prognosis, risk/ benefit, and side effects of treatment were discussed with the patient.  - Recommended keeping the area dry and clean - Recommended ZeoZorb  - Start nystatin  (MYCOSTATIN ) 100,000 unit/gram powder; Apply topically up to 4 times a day. - Continue over the counter barrier cream - encouraged offloading of buttock area   No follow-ups on file.  I, Emerick Ege, CMA am acting as scribe for Lauraine JAYSON Kanaris, MD.    Documentation: I have reviewed the above documentation for accuracy and completeness, and I agree with the above.  Lauraine JAYSON Kanaris, MD

## 2024-02-22 NOTE — Patient Instructions (Addendum)

## 2024-03-23 ENCOUNTER — Ambulatory Visit
Admission: RE | Admit: 2024-03-23 | Discharge: 2024-03-23 | Disposition: A | Discharge status: Home / Self Care | Source: Ambulatory Visit | Attending: Internal Medicine | Admitting: Internal Medicine

## 2024-03-23 DIAGNOSIS — Z1231 Encounter for screening mammogram for malignant neoplasm of breast: Secondary | ICD-10-CM | POA: Insufficient documentation
